# Patient Record
Sex: Female | Born: 1975
Health system: Southern US, Community
[De-identification: ages and names within clinical notes are randomized; demographics above are authoritative.]

## PROBLEM LIST (undated history)

## (undated) DIAGNOSIS — Z862 Personal history of diseases of the blood and blood-forming organs and certain disorders involving the immune mechanism: Secondary | ICD-10-CM

## (undated) DIAGNOSIS — Z973 Presence of spectacles and contact lenses: Secondary | ICD-10-CM

## (undated) DIAGNOSIS — I2699 Other pulmonary embolism without acute cor pulmonale: Secondary | ICD-10-CM

## (undated) DIAGNOSIS — N882 Stricture and stenosis of cervix uteri: Secondary | ICD-10-CM

## (undated) DIAGNOSIS — Z8619 Personal history of other infectious and parasitic diseases: Secondary | ICD-10-CM

## (undated) DIAGNOSIS — R350 Frequency of micturition: Secondary | ICD-10-CM

## (undated) DIAGNOSIS — R112 Nausea with vomiting, unspecified: Secondary | ICD-10-CM

## (undated) DIAGNOSIS — Z85828 Personal history of other malignant neoplasm of skin: Secondary | ICD-10-CM

## (undated) DIAGNOSIS — D66 Hereditary factor VIII deficiency: Secondary | ICD-10-CM

## (undated) DIAGNOSIS — R9389 Abnormal findings on diagnostic imaging of other specified body structures: Secondary | ICD-10-CM

## (undated) DIAGNOSIS — M1990 Unspecified osteoarthritis, unspecified site: Secondary | ICD-10-CM

## (undated) DIAGNOSIS — N939 Abnormal uterine and vaginal bleeding, unspecified: Secondary | ICD-10-CM

## (undated) HISTORY — PX: TONSILLECTOMY: SUR1361

## (undated) HISTORY — PX: THYROGLOSSAL DUCT CYST: SHX297

## (undated) HISTORY — PX: INGUINAL HERNIA REPAIR: SUR1180

---

## 1999-07-25 ENCOUNTER — Encounter (INDEPENDENT_AMBULATORY_CARE_PROVIDER_SITE_OTHER): Payer: Self-pay | Admitting: Specialist

## 1999-07-25 ENCOUNTER — Other Ambulatory Visit: Admission: RE | Admit: 1999-07-25 | Discharge: 1999-07-25 | Payer: Self-pay | Admitting: *Deleted

## 1999-12-28 ENCOUNTER — Other Ambulatory Visit: Admission: RE | Admit: 1999-12-28 | Discharge: 1999-12-28 | Payer: Self-pay | Admitting: *Deleted

## 2000-03-19 ENCOUNTER — Other Ambulatory Visit: Admission: RE | Admit: 2000-03-19 | Discharge: 2000-03-19 | Payer: Self-pay | Admitting: *Deleted

## 2000-11-19 ENCOUNTER — Other Ambulatory Visit: Admission: RE | Admit: 2000-11-19 | Discharge: 2000-11-19 | Payer: Self-pay | Admitting: *Deleted

## 2001-05-03 ENCOUNTER — Encounter: Admission: RE | Admit: 2001-05-03 | Discharge: 2001-05-03 | Payer: Self-pay | Admitting: Obstetrics and Gynecology

## 2001-11-05 ENCOUNTER — Other Ambulatory Visit: Admission: RE | Admit: 2001-11-05 | Discharge: 2001-11-05 | Payer: Self-pay | Admitting: Obstetrics and Gynecology

## 2002-10-31 ENCOUNTER — Other Ambulatory Visit: Admission: RE | Admit: 2002-10-31 | Discharge: 2002-10-31 | Payer: Self-pay | Admitting: Obstetrics and Gynecology

## 2003-11-27 ENCOUNTER — Other Ambulatory Visit: Admission: RE | Admit: 2003-11-27 | Discharge: 2003-11-27 | Payer: Self-pay | Admitting: Obstetrics and Gynecology

## 2006-10-20 ENCOUNTER — Inpatient Hospital Stay (HOSPITAL_COMMUNITY): Admission: EM | Admit: 2006-10-20 | Discharge: 2006-10-27 | Payer: Self-pay | Admitting: Emergency Medicine

## 2006-10-20 DIAGNOSIS — Z86711 Personal history of pulmonary embolism: Secondary | ICD-10-CM

## 2006-10-20 HISTORY — DX: Personal history of pulmonary embolism: Z86.711

## 2006-11-29 ENCOUNTER — Ambulatory Visit: Payer: Self-pay | Admitting: Hematology & Oncology

## 2006-12-05 ENCOUNTER — Ambulatory Visit (HOSPITAL_COMMUNITY): Admission: RE | Admit: 2006-12-05 | Discharge: 2006-12-05 | Payer: Self-pay | Admitting: Family Medicine

## 2006-12-08 ENCOUNTER — Emergency Department (HOSPITAL_COMMUNITY): Admission: EM | Admit: 2006-12-08 | Discharge: 2006-12-08 | Payer: Self-pay | Admitting: Emergency Medicine

## 2007-04-16 DIAGNOSIS — Z87410 Personal history of cervical dysplasia: Secondary | ICD-10-CM

## 2007-04-16 HISTORY — DX: Personal history of cervical dysplasia: Z87.410

## 2008-03-30 ENCOUNTER — Ambulatory Visit: Payer: Self-pay | Admitting: Hematology & Oncology

## 2008-12-06 IMAGING — CR DG CHEST 2V
2 series · 2 of 2 positions shown · non-contrast
Comparison: None.

CLINICAL DATA: Cough and chest pain.
 CHEST - 2 VIEW:

[w chest pa]
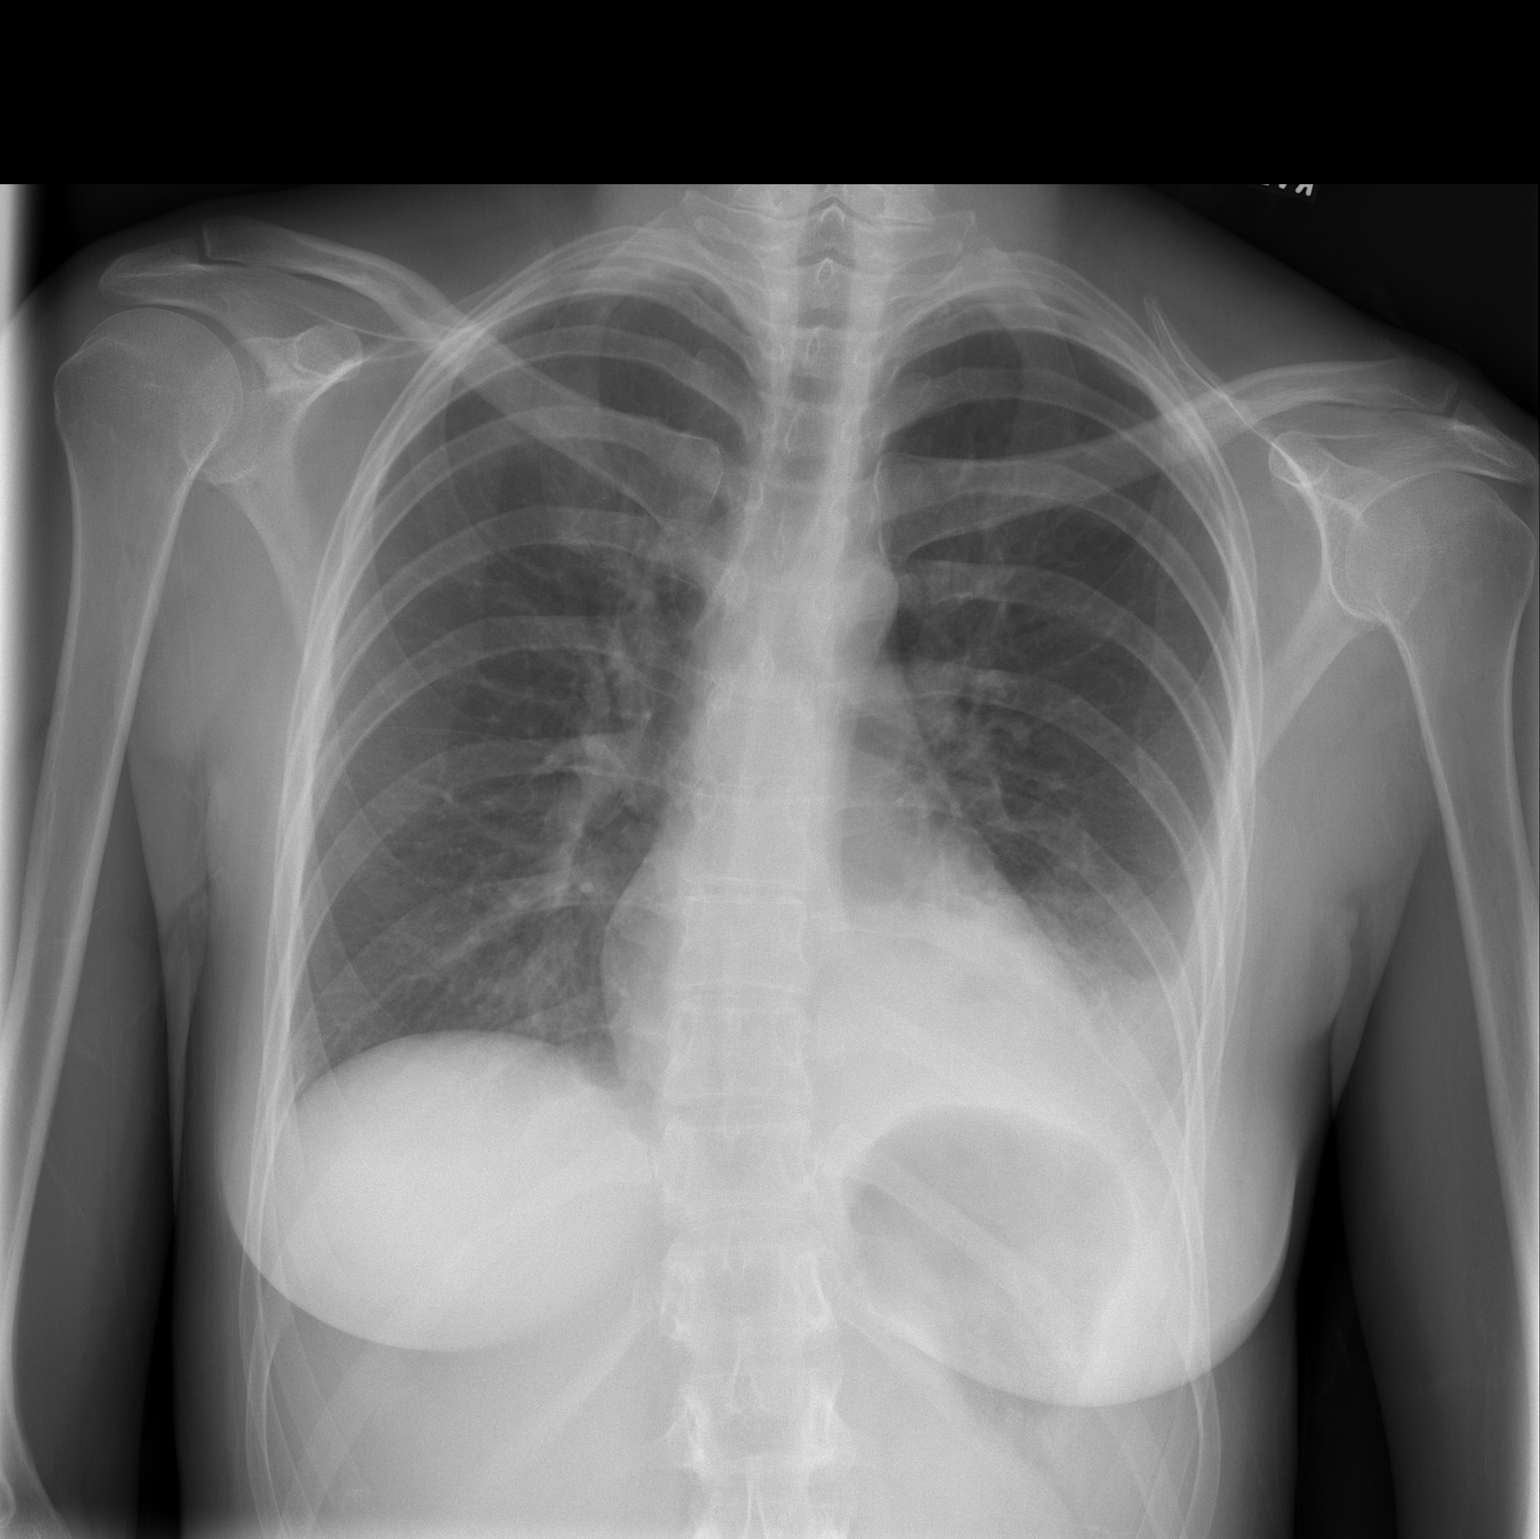

[w chest lat]
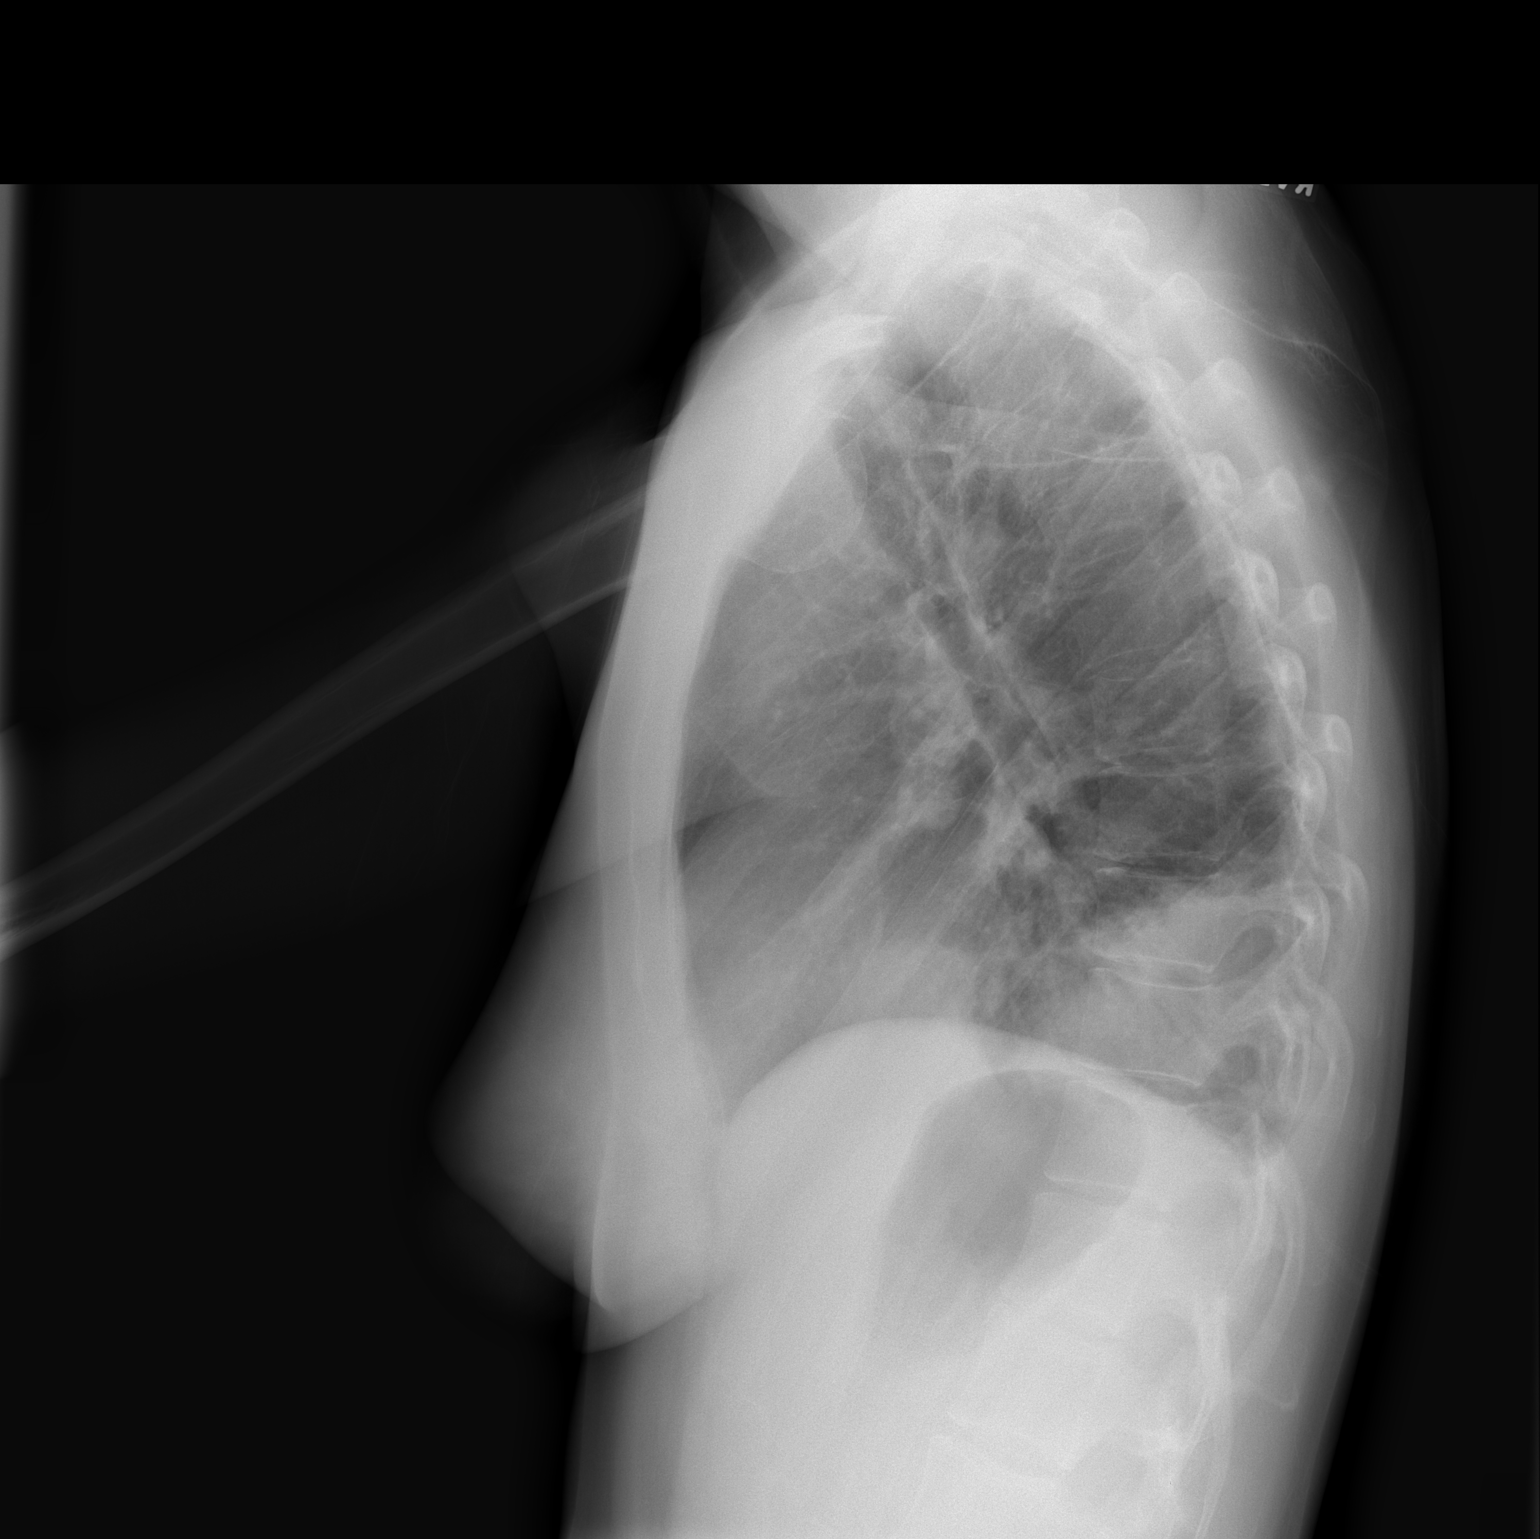

[2 of 2 positions shown; findings below may reference images not displayed]

FINDINGS: There is an infiltrate in the left lower lobe with the appearance of pneumonia.  There may be a small left effusion.  The right lung is clear.
IMPRESSION: Left lower lobe pneumonia.

## 2009-02-09 ENCOUNTER — Emergency Department (HOSPITAL_COMMUNITY): Admission: EM | Admit: 2009-02-09 | Discharge: 2009-02-09 | Payer: Self-pay | Admitting: Emergency Medicine

## 2011-01-25 LAB — DIFFERENTIAL
Basophils Absolute: 0 10*3/uL (ref 0.0–0.1)
Basophils Relative: 0 % (ref 0–1)
Eosinophils Absolute: 0 10*3/uL (ref 0.0–0.7)
Eosinophils Relative: 0 % (ref 0–5)
Lymphocytes Relative: 31 % (ref 12–46)
Lymphs Abs: 1.8 10*3/uL (ref 0.7–4.0)
Monocytes Absolute: 0.3 10*3/uL (ref 0.1–1.0)
Monocytes Relative: 6 % (ref 3–12)
Neutro Abs: 3.6 10*3/uL (ref 1.7–7.7)
Neutrophils Relative %: 62 % (ref 43–77)

## 2011-01-25 LAB — URINALYSIS, ROUTINE W REFLEX MICROSCOPIC
Bilirubin Urine: NEGATIVE
Ketones, ur: NEGATIVE mg/dL
Nitrite: NEGATIVE
Specific Gravity, Urine: 1 — ABNORMAL LOW (ref 1.005–1.030)
pH: 7 (ref 5.0–8.0)

## 2011-01-25 LAB — CBC: HCT: 44 % (ref 36.0–46.0)

## 2011-01-25 LAB — PREGNANCY, URINE: Preg Test, Ur: NEGATIVE

## 2011-01-25 LAB — POCT I-STAT, CHEM 8
BUN: 8 mg/dL (ref 6–23)
Calcium, Ion: 1.19 mmol/L (ref 1.12–1.32)
Chloride: 107 mEq/L (ref 96–112)
Creatinine, Ser: 0.8 mg/dL (ref 0.4–1.2)
Glucose, Bld: 92 mg/dL (ref 70–99)
HCT: 46 % (ref 36.0–46.0)
Hemoglobin: 15.6 g/dL — ABNORMAL HIGH (ref 12.0–15.0)
Potassium: 4.1 mEq/L (ref 3.5–5.1)
Sodium: 141 mEq/L (ref 135–145)
TCO2: 25 mmol/L (ref 0–100)

## 2011-03-03 NOTE — H&P (Signed)
NAME:  Deanna Herman, Deanna Herman            ACCOUNT NO.:  [id number]   MEDICAL RECORD NO.:  [id number]          PATIENT TYPE:  INP   LOCATION:  0101                         FACILITY:  [facility]   PHYSICIAN:  Kela Millin, M.D.DATE OF BIRTH:  13-Sep-1976   DATE OF ADMISSION:  10/20/2006  DATE OF DISCHARGE:                              HISTORY & PHYSICAL   CHIEF COMPLAINT:  Left chest pain.   HPI:  A 35 year old female with a history of tobacco use and OCP use in  the form of Yasmin 28.  She recently traveled by car from [place] to  [place] and back during the Christmas holidays and also since then  has taken two plane flights, one approximately 2 hours and one  approximately 45 minutes.  She went to an urgent care facility in [place]  two days ago and apparently had a left lower lobe infiltrate and chest  pain and was given Levaquin and Vicodin.  The chest pain continued, she  developed hemoptysis, she came to the [facility] Emergency Room today  and had a CT scan of the chest which revealed a large left lower lobe  pulmonary embolus.  She does have a slightly high PTT.  She is admitted  now for Lovenox and Coumadin pain control and monitoring.   MEDICATIONS:  1. Yasmin 28 daily.  2. Acyclovir 200 mg daily.  3. Levaquin and Vicodin for 2 days.   PAST MEDICAL HISTORY:  1. Recurrent genital herpes, on prophylaxis therapy.  2. Tobacco use - approximately 6 cigarettes a day.   ALLERGIES:  LATEX.   PAST SURGICAL HISTORY:  1. Thyroglossal cyst surgery in the distant past.  2. Bilateral inguinal hernia repair approximately at age 46.  3. Tonsillectomy.   FAMILY HISTORY:  Significant for:  1. Hypertension.  2. Obesity.  3. Type 2 diabetes mellitus but no clotting disorders.   REVIEW OF SYSTEMS:  As per HPI.  Denies fevers.   PHYSICAL EXAM:  An alert female, oriented, complaining of left chest  pain.  Temperature 98.6, blood pressure 129/74, pulse 75 and regular,  respiratory rate  is 20 and nonlabored.  The O2 saturation on room air is  97%.  HEENT:  Reveals her to be atraumatic, normocephalic.  Oropharynx is  clear.  NECK:  Supple without JVD.  She does have a scar over the left anterior  neck well healed.  CHEST:  With decreased breath sounds left lateral chest, otherwise  clear.  Increased pain with inspiration in the left lateral chest.  CARDIAC:  A regular rhythm.  No murmurs or gallops.  ABDOMEN:  Soft, nontender, bowel sounds are normal.  EXTREMITIES:  Without cyanosis, clubbing or edema.  No Homan's sign or  painful cords in the thighs or calves.  No pain the lower extremities to  palpation.  NEUROLOGICAL:  Oriented x3, nonfocal.  SKIN:  Without rashes.   Chest x-ray reveals a left lower lobe infiltrate.   A CT angio of the chest reveals a large left pulmonary embolus in the  lung base.   EKG reveals sinus rhythm - normal EKG.   White count  9500, hemoglobin 12.4, platelet count 337,000.  Sodium 134,  potassium 3.5, chloride 101, bicarb 25, BUN 7, creatinine 0.67, glucose  102.  LFTs are normal.  Calcium 9.1, PT is 14.6 seconds, PTT is 41  seconds and INR is 1.1.   ASSESSMENT:  A 35 year old female with pulmonary embolus with multiple  risk factors, the travel, tobacco use and oral contraceptive pill use.  She does have a mild increase in her PTT and will check for lupus  anticoagulant and anticardiolipin antibody and also check Factor 5  Leiden Assay.  Lovenox started tonight and is able to start Coumadin.   PLAN:  1. PE - Lovenox and Coumadin and Pharmacy to monitor.  2. Pain - Treat with IV Toradol for 48 hours and also Percocet.  3. Tobacco use - Consider Chantix on discharge from the hospital -      Patient perhaps would like to try it at that time but not now.  4. History of genital herpes - Continue acyclovir prophylaxis.  5. Increased PTT - As above.  I wonder if this may be slightly      elevated secondary to heavy clot burden.  Again  will check lupus      anticoagulant and anticardiolipin antibody.  6. Change birth control methods to not incorporate progestins and      estrogens.      Candyce Churn, M.D.      Kela Millin, M.D.  Electronically Signed    RNG/MEDQ  D:  10/21/2006  T:  10/21/2006  Job:  [id number]   cc:   Sigmund Hazel, M.D.  Fax: [phone]   Kela Millin, M.D.

## 2011-03-03 NOTE — Discharge Summary (Signed)
NAME:  Deanna Herman, Deanna Herman            ACCOUNT NO.:  [id number]   MEDICAL RECORD NO.:  [id number]          PATIENT TYPE:  INP   LOCATION:  1444                         FACILITY:  [facility]   PHYSICIAN:  Michelene Gardener, MD    DATE OF BIRTH:  08-May-1976   DATE OF ADMISSION:  10/20/2006  DATE OF DISCHARGE:  08/28/2007                               DISCHARGE SUMMARY   DISCHARGE DIAGNOSES:  1. Pulmonary embolus.  2. Chest pain.  3. Tobacco abuse.  4. History of genital herpes.  5. Iron deficiency anemia.  6. Upper respiratory tract infection.   DISCHARGE MEDICATIONS:  1. Coumadin 12.5 mg p.o. once daily.  2. Ferrous sulfate 325 mg  p.o. twice daily.  3. Acyclovir 200 mg p.o. daily.  4. Yasmin 28 daily.   PROCEDURES:  1. Chest x-ray on January 5, showed left lower lobe pneumonia.  2. Chest angiogram showed pulmonary embolism in the process of      pulmonary artery to the left lower lobe which is partially      occluded.   FOLLOW UP:  Followup appointment on Monday, October 29, 2006.   CONDITION ON DISCHARGE:  Stable.   REASON FOR ADMISSION:  This is a 35 year old female with history of  tobacco abuse on oral contraceptive pills [place] during the [place] holiday  presenting with increasing left-sided chest pain.   HOSPITAL COURSE:  Problem 1.  PULMONARY EMBOLISM:  This patient had CT  scan of the chest and the results were mentioned above.  The patient was  admitted to the hospital and started initially on oxygen that was  discontinued after her saturation was improved.  She was also started on  pain medicine to control her left-sided chest pain.  This patient was  put on Lovenox and Coumadin.  Her INR was slow to rise.  Coumadin has  been adjusted by the pharmacy.  In the last 2 days, her INR was 1.7.  Coumadin was increased to 12.5 in the last 2 days of her  hospitalization.  She was given Coumadin prescription to take  on  Saturday, Sunday and then to follow with her primary physician on Monday  to get her INR checked and adjust Coumadin as needed.   Problem 2.  LEFT-SIDED PAIN SECONDARY TO HER PULMONARY EMBOLISM:  This  was controlled with pain medicine.  At the time of discharge, her pain  resolved.   Problem 3.  PNEUMONIA:  The patient has left-sided possible infiltrate.  She was given antibiotics during her hospitalization.  She already  completed 6 days of antibiotics and there is no need for more  antibiotics.   Problem 4.  ANEMIA:  The patient was started on iron and ferrous  sulfate.  This can be checked as an outpatient by her primary physician  to determine if she needs to continue the iron sulfate or not.   Problem 5.  GENITALIA HERPES:  The patient is getting acyclovir  prophylaxis.  She is recommended to continue the same.   Total assessment time 40 minutes.  Michelene Gardener, MD  Electronically Signed     NAE/MEDQ  D:  10/27/2006  T:  10/27/2006  Job:  [id number]   cc:   Sigmund Hazel, M.D.  Fax: [phone]

## 2013-05-14 ENCOUNTER — Other Ambulatory Visit: Payer: Self-pay | Admitting: Family Medicine

## 2013-05-14 ENCOUNTER — Ambulatory Visit
Admission: RE | Admit: 2013-05-14 | Discharge: 2013-05-14 | Disposition: A | Discharge status: Home / Self Care | Payer: 59 | Source: Ambulatory Visit | Attending: Family Medicine | Admitting: Family Medicine

## 2013-05-14 DIAGNOSIS — R51 Headache: Secondary | ICD-10-CM

## 2015-05-18 ENCOUNTER — Other Ambulatory Visit: Payer: Self-pay | Admitting: Family Medicine

## 2015-05-18 DIAGNOSIS — R221 Localized swelling, mass and lump, neck: Secondary | ICD-10-CM

## 2015-05-21 ENCOUNTER — Ambulatory Visit
Admission: RE | Admit: 2015-05-21 | Discharge: 2015-05-21 | Disposition: A | Discharge status: Home / Self Care | Payer: 59 | Source: Ambulatory Visit | Attending: Family Medicine | Admitting: Family Medicine

## 2015-05-21 DIAGNOSIS — R221 Localized swelling, mass and lump, neck: Secondary | ICD-10-CM

## 2015-05-21 MED ORDER — IOPAMIDOL (ISOVUE-300) INJECTION 61%
75.0000 mL | Freq: Once | INTRAVENOUS | Status: AC | PRN
  Administered 2015-05-21: 75 mL via INTRAVENOUS

## 2016-10-19 DIAGNOSIS — H905 Unspecified sensorineural hearing loss: Secondary | ICD-10-CM | POA: Diagnosis not present

## 2016-10-19 DIAGNOSIS — Z23 Encounter for immunization: Secondary | ICD-10-CM | POA: Diagnosis not present

## 2016-10-19 DIAGNOSIS — Z0000 Encounter for general adult medical examination without abnormal findings: Secondary | ICD-10-CM | POA: Diagnosis not present

## 2016-10-19 DIAGNOSIS — M545 Low back pain: Secondary | ICD-10-CM | POA: Diagnosis not present

## 2016-12-26 DIAGNOSIS — D225 Melanocytic nevi of trunk: Secondary | ICD-10-CM | POA: Diagnosis not present

## 2016-12-26 DIAGNOSIS — L700 Acne vulgaris: Secondary | ICD-10-CM | POA: Diagnosis not present

## 2016-12-26 DIAGNOSIS — D2262 Melanocytic nevi of left upper limb, including shoulder: Secondary | ICD-10-CM | POA: Diagnosis not present

## 2017-04-24 DIAGNOSIS — Z01419 Encounter for gynecological examination (general) (routine) without abnormal findings: Secondary | ICD-10-CM | POA: Diagnosis not present

## 2017-04-24 DIAGNOSIS — R6889 Other general symptoms and signs: Secondary | ICD-10-CM | POA: Diagnosis not present

## 2017-07-07 IMAGING — CT CT NECK W/ CM
4 of 6 series · 14 of 33 positions shown, 16 images · IV contrast (75CC ISOVUE 300)
Comparison: None.

CLINICAL DATA: Thyroglossal duct cyst with surgery 3333 and 7650.
Discomfort when swallowing. Hoarseness.

EXAM:
CT NECK WITH CONTRAST
TECHNIQUE: Multidetector CT imaging of the neck was performed using the
standard protocol following the bolus administration of intravenous
contrast.
CONTRAST:  75mL J4CVNL-VGG IOPAMIDOL (J4CVNL-VGG) INJECTION 61%

[Series 3: axial neck · axial · 0.39mm/px · z∈[+140,+218]mm · 2 of 95 slices shown]
[im 32/95  bone]
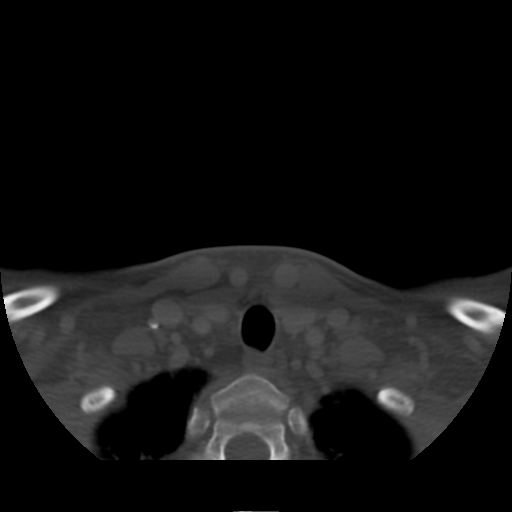
[im 63/95  bone]
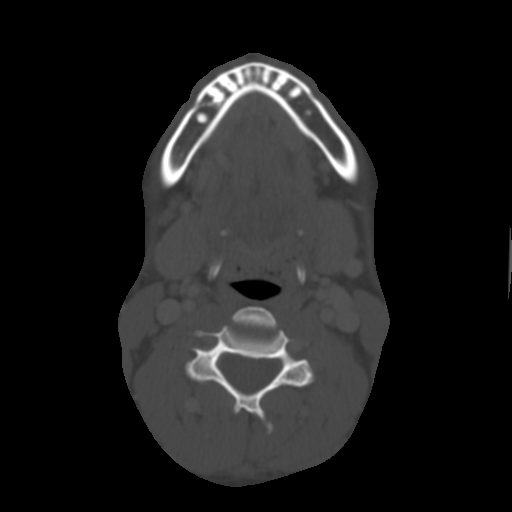

[Series 200: coronal · coronal · 0.47mm/px · 3 of 98 slices shown]
[im 24/98  bone]
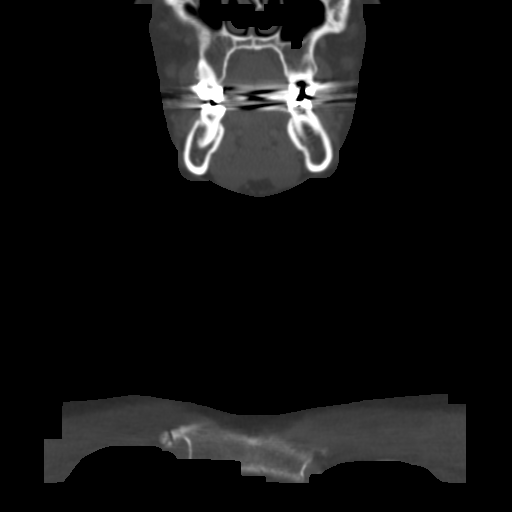
[im 41/98  bone]
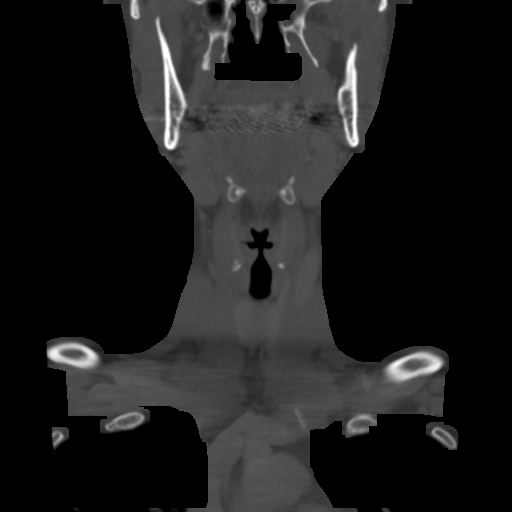
[im 57/98  bone]
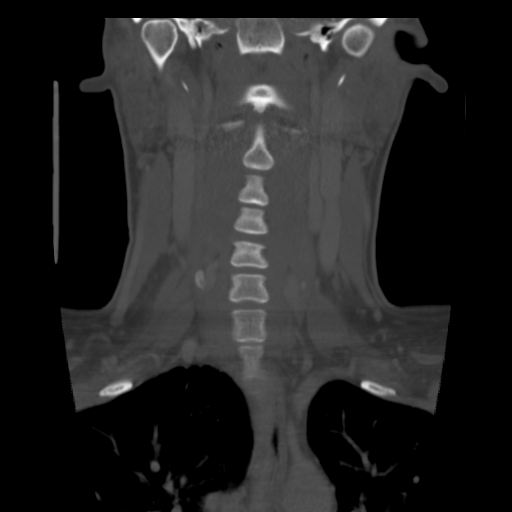

[Series 201: sagittal · sagittal · 0.47mm/px · 5 of 102 slices shown, 6 images]
[im 34/102  bone]
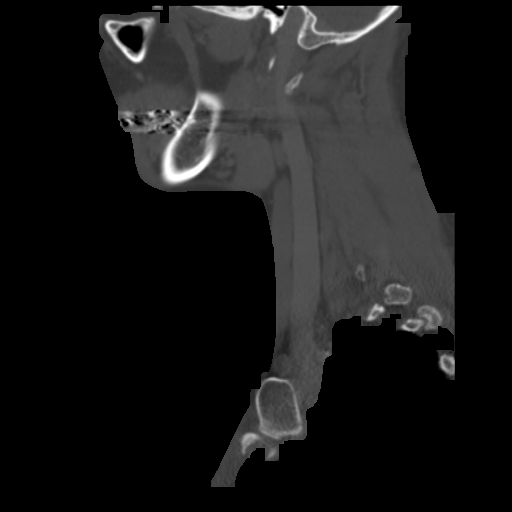
[im 43/102  bone]
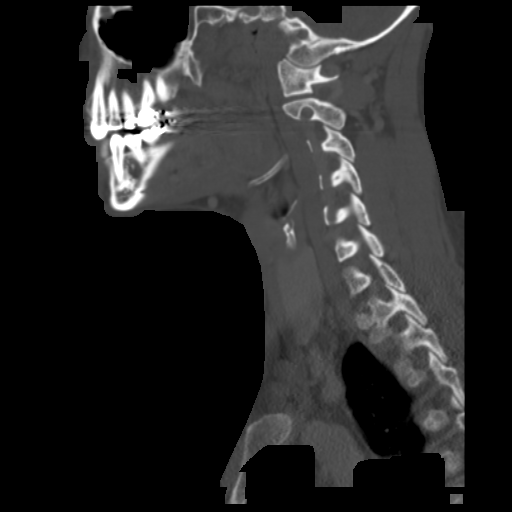
[im 51/102  soft-tissue]
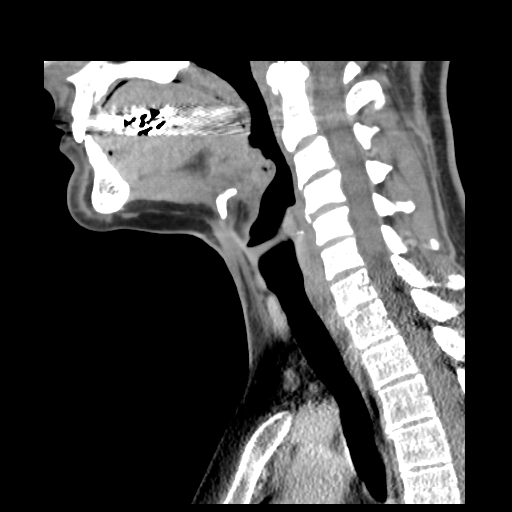
[im 51/102  bone]
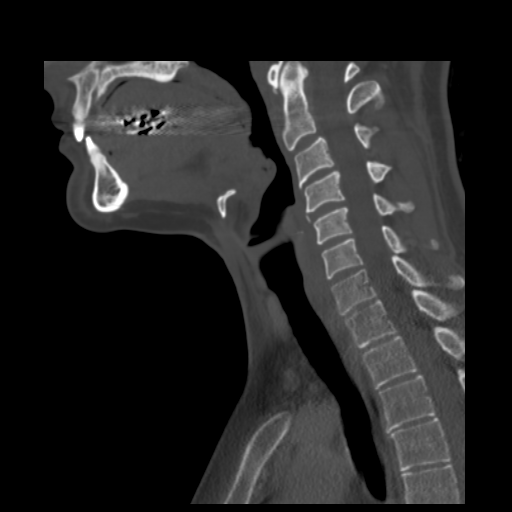
[im 59/102  bone]
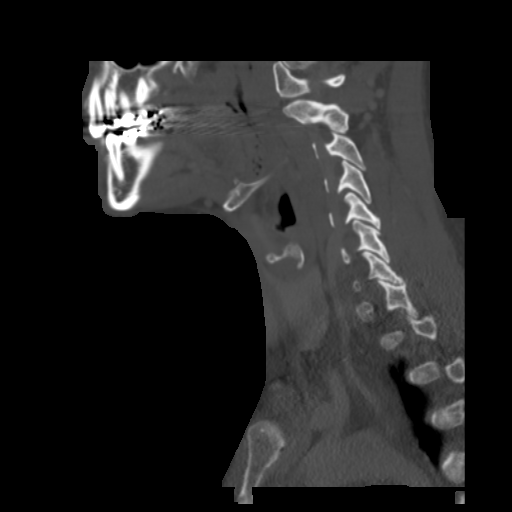
[im 68/102  bone]
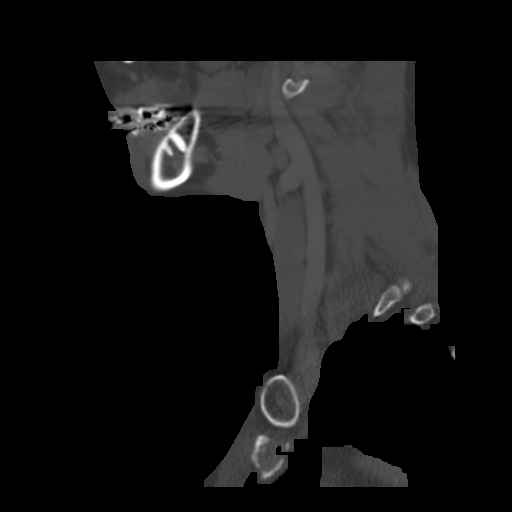

[Series 202: angle for hyoid · axial · 0.47mm/px · z∈[+77,+230]mm · 4 of 130 slices shown, 5 images]
[im 26/130  soft-tissue]
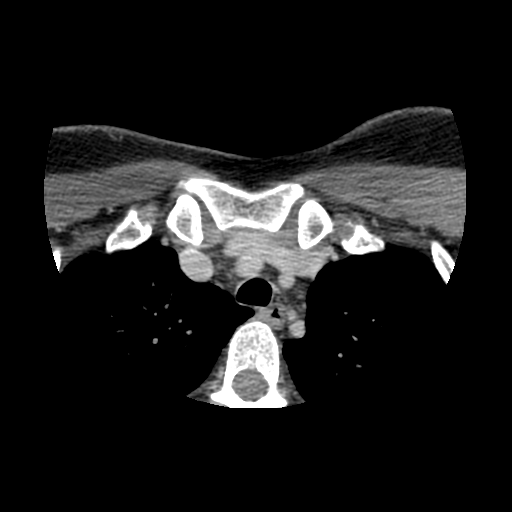
[im 26/130  bone]
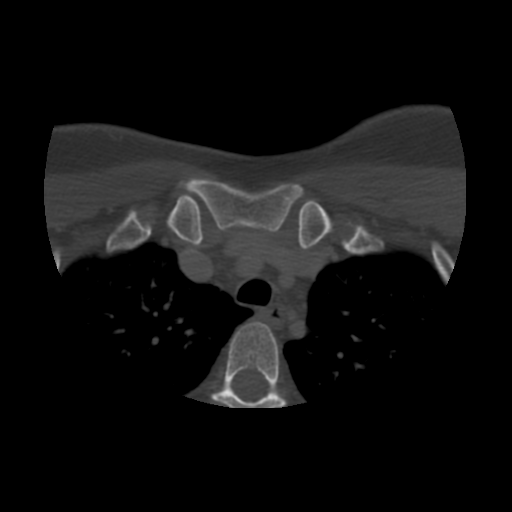
[im 52/130  bone]
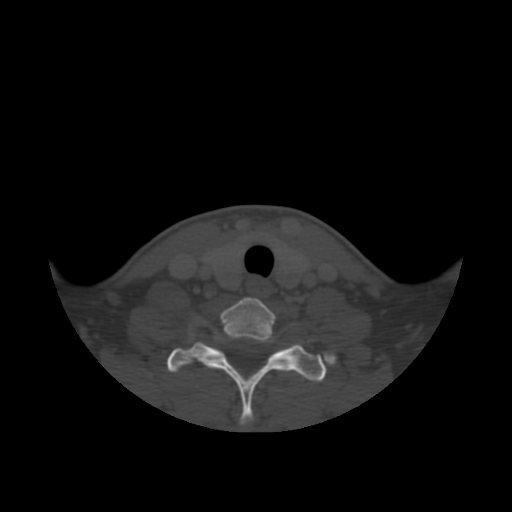
[im 78/130  bone]
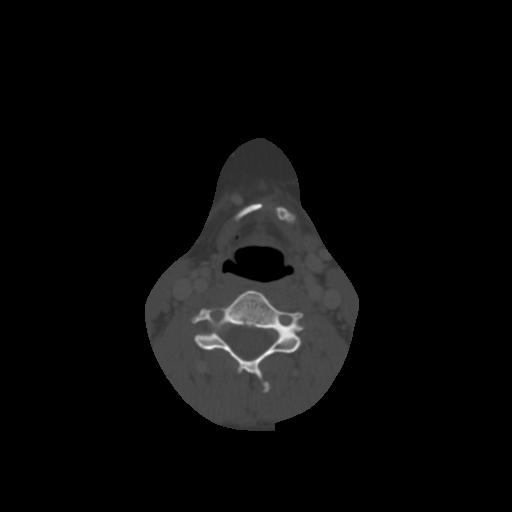
[im 104/130  bone]
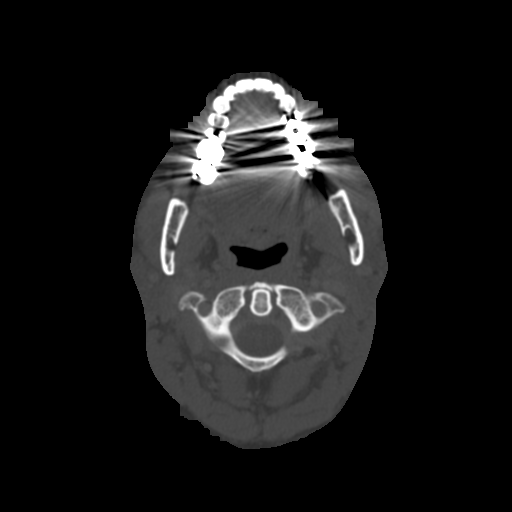

[14 of 33 positions shown; findings below may reference images not displayed]

FINDINGS: Soft tissue thickening in the anterior neck just below the hyoid
bone. There is mild enhancement of the anterior strap muscles with
an ill-defined 6 mm low-density area which appears to be a collapsed
cyst with irregular enhancement of the wall. There is also some skin
thickening in the area. Findings appear to represent inflammation or
infection.

Pharynx and larynx: Negative

Salivary glands: Negative

Thyroid: Normal thyroid.

Lymph nodes: Negative for adenopathy. 6.7 mm left level 2 lymph node

Vascular: Carotid artery normal.  Jugular vein patent.

Limited intracranial: Negative

Visualized orbits: Not imaged

Mastoids and visualized paranasal sinuses: Negative

Skeleton: Mild disc degeneration C3-4 and C4-5. No acute bony
abnormality

Upper chest: Lung apices clear.
IMPRESSION: Soft tissue thickening in the anterior neck does below the hyoid
bone in the strap muscles. There is a ill-defined 6 mm collapsed
cyst with enhancement of the wall and thickening of the overlying
skin. Findings are suggestive of inflammation of a residual
thyroglossal duct cyst. Otherwise negative

## 2017-08-01 DIAGNOSIS — Z808 Family history of malignant neoplasm of other organs or systems: Secondary | ICD-10-CM | POA: Diagnosis not present

## 2017-08-01 DIAGNOSIS — D225 Melanocytic nevi of trunk: Secondary | ICD-10-CM | POA: Diagnosis not present

## 2017-08-01 DIAGNOSIS — Z86018 Personal history of other benign neoplasm: Secondary | ICD-10-CM | POA: Diagnosis not present

## 2017-09-26 DIAGNOSIS — R1032 Left lower quadrant pain: Secondary | ICD-10-CM | POA: Diagnosis not present

## 2017-10-23 DIAGNOSIS — L237 Allergic contact dermatitis due to plants, except food: Secondary | ICD-10-CM | POA: Diagnosis not present

## 2017-11-15 DIAGNOSIS — R102 Pelvic and perineal pain: Secondary | ICD-10-CM | POA: Diagnosis not present

## 2018-04-09 DIAGNOSIS — S0501XA Injury of conjunctiva and corneal abrasion without foreign body, right eye, initial encounter: Secondary | ICD-10-CM | POA: Diagnosis not present

## 2018-04-24 ENCOUNTER — Encounter (HOSPITAL_COMMUNITY): Payer: Self-pay | Admitting: Emergency Medicine

## 2018-04-24 ENCOUNTER — Ambulatory Visit (HOSPITAL_COMMUNITY)
Admission: EM | Admit: 2018-04-24 | Discharge: 2018-04-24 | Disposition: A | Discharge status: Home / Self Care | Payer: 59 | Attending: Family Medicine | Admitting: Family Medicine

## 2018-04-24 DIAGNOSIS — S61217A Laceration without foreign body of left little finger without damage to nail, initial encounter: Secondary | ICD-10-CM

## 2018-04-24 DIAGNOSIS — W260XXA Contact with knife, initial encounter: Secondary | ICD-10-CM | POA: Diagnosis not present

## 2018-04-24 HISTORY — DX: Other pulmonary embolism without acute cor pulmonale: I26.99

## 2018-04-24 NOTE — ED Triage Notes (Signed)
Pt sts left 5th finger laceration; bleeding controlled

## 2018-04-24 NOTE — Discharge Instructions (Signed)
Steristrips applied. This will fall off on its own when the wound is healed, usually 7-10 days. Try to keep current dressing with finger splint on so it doesn't pop open. Monitor for spreading redness, increased warmth, fever, follow up for reevaluation.

## 2018-04-24 NOTE — ED Provider Notes (Signed)
[place]    CSN: [id number] Arrival date & time: 04/24/18  1611     History   Chief Complaint Chief Complaint  Patient presents with  . Laceration    HPI Deanna Herman is a 42 y.o. female.   42 year old female comes in for laceration to the left fifth finger from a box cutter.  States was trying to open a box when it occurred.  Cleaned wound with water, applied antibiotic ointment, and came in for evaluation.  Denies numbness, tingling to the fingers.  Denies trouble moving fingers.  Bleeding controlled with pressure. Not on any blood thinners.  Up-to-date on tetanus.     Past Medical History:  Diagnosis Date  . Pulmonary emboli (HCC)     There are no active problems to display for this patient.   History reviewed. No pertinent surgical history.  OB History   None      Home Medications    Prior to Admission medications   Not on File    Family History History reviewed. No pertinent family history.  Social History Social History   Tobacco Use  . Smoking status: Never Smoker  . Smokeless tobacco: Never Used  Substance Use Topics  . Alcohol use: Yes  . Drug use: Never     Allergies   Latex and Tamiflu [oseltamivir phosphate]   Review of Systems Review of Systems  Reason unable to perform ROS: See HPI as above.     Physical Exam Triage Vital Signs ED Triage Vitals [04/24/18 1644]  Enc Vitals Group     BP 133/72     Pulse Rate 63     Resp 18     Temp 98.3 F (36.8 C)     Temp Source Oral     SpO2 100 %     Weight      Height      Head Circumference      Peak Flow      Pain Score      Pain Loc      Pain Edu?      Excl. in [place]?    No data found.  Updated Vital Signs BP 133/72 (BP Location: Left Arm)   Pulse 63   Temp 98.3 F (36.8 C) (Oral)   Resp 18   SpO2 100%   Physical Exam  Constitutional: She is oriented to person, place, and time. She appears well-developed and well-nourished. No distress.  HENT:    Head: Normocephalic and atraumatic.  Eyes: Pupils are equal, round, and reactive to light. Conjunctivae are normal.  Musculoskeletal:  Left fifth finger oblique laceration on the PIP. Bleeding controlled. Full ROM. Sensation intact, cap refill <2s  Neurological: She is alert and oriented to person, place, and time.  Skin: She is not diaphoretic.     UC Treatments / Results  Labs (all labs ordered are listed, but only abnormal results are displayed) Labs Reviewed - No data to display  EKG None  Radiology No results found.  Procedures Laceration Repair Date/Time: 04/24/2018 9:00 PM Performed by: Ok Edwards, PA-C Authorized by: Vanessa Kick, MD   Consent:    Consent obtained:  Verbal   Consent given by:  Patient   Risks discussed:  Infection, pain, poor cosmetic result and poor wound healing   Alternatives discussed:  No treatment Anesthesia (see MAR for exact dosages):    Anesthesia method:  None Laceration details:    Location:  Finger   Finger location:  L  small finger   Wound length (cm): cresent shaped, 1cm.   Depth (mm):  1 Repair type:    Repair type:  Simple Pre-procedure details:    Preparation:  Patient was prepped and draped in usual sterile fashion Exploration:    Hemostasis achieved with:  Direct pressure   Wound exploration: wound explored through full range of motion and entire depth of wound probed and visualized   Treatment:    Area cleansed with:  Betadine   Amount of cleaning:  Standard   Irrigation solution:  Sterile saline   Irrigation method:  Pressure wash and tap Skin repair:    Repair method:  Steri-Strips   Number of Steri-Strips:  4 Approximation:    Approximation:  Close Post-procedure details:    Dressing:  Open (no dressing)   Patient tolerance of procedure:  Tolerated well, no immediate complications   (including critical care time)  Medications Ordered in UC Medications - No data to display  Initial Impression / Assessment  and Plan / UC Course  I have reviewed the triage vital signs and the nursing notes.  Pertinent labs & imaging results that were available during my care of the patient were reviewed by me and considered in my medical decision making (see chart for details).    Patient tolerated procedure well.  Finger splint applied to prevent bending of the fingers.  Healing process discussed.  Return precautions given.  Patient expresses understanding and agrees to plan.  Final Clinical Impressions(s) / UC Diagnoses   Final diagnoses:  Laceration of left little finger without foreign body without damage to nail, initial encounter    ED Prescriptions    None         Ok Edwards, PA-C 04/24/18 2103

## 2018-05-07 DIAGNOSIS — Z1231 Encounter for screening mammogram for malignant neoplasm of breast: Secondary | ICD-10-CM | POA: Diagnosis not present

## 2018-05-07 DIAGNOSIS — Z01419 Encounter for gynecological examination (general) (routine) without abnormal findings: Secondary | ICD-10-CM | POA: Diagnosis not present

## 2018-08-21 DIAGNOSIS — Z808 Family history of malignant neoplasm of other organs or systems: Secondary | ICD-10-CM | POA: Diagnosis not present

## 2018-08-21 DIAGNOSIS — Z86018 Personal history of other benign neoplasm: Secondary | ICD-10-CM | POA: Diagnosis not present

## 2018-08-21 DIAGNOSIS — D225 Melanocytic nevi of trunk: Secondary | ICD-10-CM | POA: Diagnosis not present

## 2018-08-21 DIAGNOSIS — D2261 Melanocytic nevi of right upper limb, including shoulder: Secondary | ICD-10-CM | POA: Diagnosis not present

## 2018-08-21 DIAGNOSIS — L739 Follicular disorder, unspecified: Secondary | ICD-10-CM | POA: Diagnosis not present

## 2018-08-21 DIAGNOSIS — D485 Neoplasm of uncertain behavior of skin: Secondary | ICD-10-CM | POA: Diagnosis not present

## 2018-09-19 DIAGNOSIS — R197 Diarrhea, unspecified: Secondary | ICD-10-CM | POA: Diagnosis not present

## 2018-10-24 DIAGNOSIS — W11XXXA Fall on and from ladder, initial encounter: Secondary | ICD-10-CM | POA: Diagnosis not present

## 2018-10-24 DIAGNOSIS — M25561 Pain in right knee: Secondary | ICD-10-CM | POA: Diagnosis not present

## 2018-10-25 DIAGNOSIS — S82001A Unspecified fracture of right patella, initial encounter for closed fracture: Secondary | ICD-10-CM | POA: Diagnosis not present

## 2018-11-01 DIAGNOSIS — S82001D Unspecified fracture of right patella, subsequent encounter for closed fracture with routine healing: Secondary | ICD-10-CM | POA: Diagnosis not present

## 2018-11-01 DIAGNOSIS — S82001A Unspecified fracture of right patella, initial encounter for closed fracture: Secondary | ICD-10-CM | POA: Diagnosis not present

## 2018-11-11 DIAGNOSIS — S82001A Unspecified fracture of right patella, initial encounter for closed fracture: Secondary | ICD-10-CM | POA: Diagnosis not present

## 2018-11-11 DIAGNOSIS — M25561 Pain in right knee: Secondary | ICD-10-CM | POA: Diagnosis not present

## 2018-11-11 DIAGNOSIS — S82001D Unspecified fracture of right patella, subsequent encounter for closed fracture with routine healing: Secondary | ICD-10-CM | POA: Diagnosis not present

## 2018-11-25 DIAGNOSIS — S82001D Unspecified fracture of right patella, subsequent encounter for closed fracture with routine healing: Secondary | ICD-10-CM | POA: Diagnosis not present

## 2018-11-25 DIAGNOSIS — S82001A Unspecified fracture of right patella, initial encounter for closed fracture: Secondary | ICD-10-CM | POA: Diagnosis not present

## 2018-12-16 DIAGNOSIS — S82001A Unspecified fracture of right patella, initial encounter for closed fracture: Secondary | ICD-10-CM | POA: Diagnosis not present

## 2018-12-16 DIAGNOSIS — S82001D Unspecified fracture of right patella, subsequent encounter for closed fracture with routine healing: Secondary | ICD-10-CM | POA: Diagnosis not present

## 2019-06-27 DIAGNOSIS — D1809 Hemangioma of other sites: Secondary | ICD-10-CM | POA: Diagnosis not present

## 2019-06-27 DIAGNOSIS — Z01419 Encounter for gynecological examination (general) (routine) without abnormal findings: Secondary | ICD-10-CM | POA: Diagnosis not present

## 2019-06-27 DIAGNOSIS — Z1231 Encounter for screening mammogram for malignant neoplasm of breast: Secondary | ICD-10-CM | POA: Diagnosis not present

## 2019-06-27 DIAGNOSIS — N9089 Other specified noninflammatory disorders of vulva and perineum: Secondary | ICD-10-CM | POA: Diagnosis not present

## 2019-06-27 DIAGNOSIS — Z6830 Body mass index (BMI) 30.0-30.9, adult: Secondary | ICD-10-CM | POA: Diagnosis not present

## 2019-09-17 DIAGNOSIS — Z20828 Contact with and (suspected) exposure to other viral communicable diseases: Secondary | ICD-10-CM | POA: Diagnosis not present

## 2019-09-24 DIAGNOSIS — D225 Melanocytic nevi of trunk: Secondary | ICD-10-CM | POA: Diagnosis not present

## 2019-09-24 DIAGNOSIS — D2272 Melanocytic nevi of left lower limb, including hip: Secondary | ICD-10-CM | POA: Diagnosis not present

## 2019-09-24 DIAGNOSIS — Z808 Family history of malignant neoplasm of other organs or systems: Secondary | ICD-10-CM | POA: Diagnosis not present

## 2019-09-24 DIAGNOSIS — Z86018 Personal history of other benign neoplasm: Secondary | ICD-10-CM | POA: Diagnosis not present

## 2019-09-24 DIAGNOSIS — L570 Actinic keratosis: Secondary | ICD-10-CM | POA: Diagnosis not present

## 2019-09-24 DIAGNOSIS — D485 Neoplasm of uncertain behavior of skin: Secondary | ICD-10-CM | POA: Diagnosis not present

## 2019-10-06 DIAGNOSIS — Z20828 Contact with and (suspected) exposure to other viral communicable diseases: Secondary | ICD-10-CM | POA: Diagnosis not present

## 2020-01-01 ENCOUNTER — Ambulatory Visit: Payer: Self-pay | Attending: Internal Medicine

## 2020-01-01 DIAGNOSIS — Z23 Encounter for immunization: Secondary | ICD-10-CM

## 2020-01-01 NOTE — Progress Notes (Signed)
   Covid-19 Vaccination Clinic  Name:  Deanna Herman    MRN: [id number] DOB: Dec 24, 1975  01/01/2020  [name] was observed post Covid-19 immunization for 15 minutes without incident. She was provided with Vaccine Information Sheet and instruction to access the V-Safe system.   [name] was instructed to call 911 with any severe reactions post vaccine: Marland Kitchen Difficulty breathing  . Swelling of face and throat  . A fast heartbeat  . A bad rash all over body  . Dizziness and weakness   Immunizations Administered    Name Date Dose VIS Date Route   Pfizer COVID-19 Vaccine 01/01/2020 10:06 AM 0.3 mL 09/26/2019 Intramuscular   Manufacturer: [place]   Lot: [id number]   [place]: [id number]

## 2020-01-13 DIAGNOSIS — Z872 Personal history of diseases of the skin and subcutaneous tissue: Secondary | ICD-10-CM | POA: Diagnosis not present

## 2020-01-13 DIAGNOSIS — L570 Actinic keratosis: Secondary | ICD-10-CM | POA: Diagnosis not present

## 2020-01-26 ENCOUNTER — Ambulatory Visit: Payer: Self-pay | Attending: Internal Medicine

## 2020-01-26 DIAGNOSIS — Z23 Encounter for immunization: Secondary | ICD-10-CM

## 2020-01-26 NOTE — Progress Notes (Signed)
   Covid-19 Vaccination Clinic  Name:  Deanna Herman    MRN: [id number] DOB: 1976-07-27  01/26/2020  [name] was observed post Covid-19 immunization for 15 minutes without incident. She was provided with Vaccine Information Sheet and instruction to access the V-Safe system.   [name] was instructed to call 911 with any severe reactions post vaccine: Marland Kitchen Difficulty breathing  . Swelling of face and throat  . A fast heartbeat  . A bad rash all over body  . Dizziness and weakness   Immunizations Administered    Name Date Dose VIS Date Route   Pfizer COVID-19 Vaccine 01/26/2020  9:22 AM 0.3 mL 09/26/2019 Intramuscular   Manufacturer: [place]   Lot: [id number]   [place]: [id number]

## 2020-02-24 DIAGNOSIS — R102 Pelvic and perineal pain: Secondary | ICD-10-CM | POA: Diagnosis not present

## 2020-05-05 DIAGNOSIS — B351 Tinea unguium: Secondary | ICD-10-CM | POA: Diagnosis not present

## 2020-05-25 DIAGNOSIS — J029 Acute pharyngitis, unspecified: Secondary | ICD-10-CM | POA: Diagnosis not present

## 2020-05-26 DIAGNOSIS — R05 Cough: Secondary | ICD-10-CM | POA: Diagnosis not present

## 2020-06-29 DIAGNOSIS — Z6831 Body mass index (BMI) 31.0-31.9, adult: Secondary | ICD-10-CM | POA: Diagnosis not present

## 2020-06-29 DIAGNOSIS — Z1231 Encounter for screening mammogram for malignant neoplasm of breast: Secondary | ICD-10-CM | POA: Diagnosis not present

## 2020-06-29 DIAGNOSIS — Z01419 Encounter for gynecological examination (general) (routine) without abnormal findings: Secondary | ICD-10-CM | POA: Diagnosis not present

## 2020-08-20 DIAGNOSIS — Z20822 Contact with and (suspected) exposure to covid-19: Secondary | ICD-10-CM | POA: Diagnosis not present

## 2020-08-27 DIAGNOSIS — Z13220 Encounter for screening for lipoid disorders: Secondary | ICD-10-CM | POA: Diagnosis not present

## 2020-08-27 DIAGNOSIS — Z0000 Encounter for general adult medical examination without abnormal findings: Secondary | ICD-10-CM | POA: Diagnosis not present

## 2020-09-28 DIAGNOSIS — D485 Neoplasm of uncertain behavior of skin: Secondary | ICD-10-CM | POA: Diagnosis not present

## 2020-09-28 DIAGNOSIS — D225 Melanocytic nevi of trunk: Secondary | ICD-10-CM | POA: Diagnosis not present

## 2020-09-28 DIAGNOSIS — L814 Other melanin hyperpigmentation: Secondary | ICD-10-CM | POA: Diagnosis not present

## 2020-09-28 DIAGNOSIS — Z86018 Personal history of other benign neoplasm: Secondary | ICD-10-CM | POA: Diagnosis not present

## 2020-09-28 DIAGNOSIS — Z808 Family history of malignant neoplasm of other organs or systems: Secondary | ICD-10-CM | POA: Diagnosis not present

## 2020-10-05 DIAGNOSIS — H93293 Other abnormal auditory perceptions, bilateral: Secondary | ICD-10-CM | POA: Diagnosis not present

## 2021-07-08 DIAGNOSIS — Z6832 Body mass index (BMI) 32.0-32.9, adult: Secondary | ICD-10-CM | POA: Diagnosis not present

## 2021-07-08 DIAGNOSIS — Z01419 Encounter for gynecological examination (general) (routine) without abnormal findings: Secondary | ICD-10-CM | POA: Diagnosis not present

## 2021-07-08 DIAGNOSIS — Z113 Encounter for screening for infections with a predominantly sexual mode of transmission: Secondary | ICD-10-CM | POA: Diagnosis not present

## 2021-07-08 DIAGNOSIS — R350 Frequency of micturition: Secondary | ICD-10-CM | POA: Diagnosis not present

## 2021-07-08 DIAGNOSIS — Z1231 Encounter for screening mammogram for malignant neoplasm of breast: Secondary | ICD-10-CM | POA: Diagnosis not present

## 2021-07-08 DIAGNOSIS — Z124 Encounter for screening for malignant neoplasm of cervix: Secondary | ICD-10-CM | POA: Diagnosis not present

## 2021-09-21 DIAGNOSIS — N939 Abnormal uterine and vaginal bleeding, unspecified: Secondary | ICD-10-CM | POA: Diagnosis not present

## 2021-09-21 DIAGNOSIS — Z3202 Encounter for pregnancy test, result negative: Secondary | ICD-10-CM | POA: Diagnosis not present

## 2021-09-30 DIAGNOSIS — N939 Abnormal uterine and vaginal bleeding, unspecified: Secondary | ICD-10-CM | POA: Diagnosis not present

## 2021-09-30 DIAGNOSIS — Z5309 Procedure and treatment not carried out because of other contraindication: Secondary | ICD-10-CM | POA: Diagnosis not present

## 2021-10-04 DIAGNOSIS — L02426 Furuncle of left lower limb: Secondary | ICD-10-CM | POA: Diagnosis not present

## 2021-10-04 DIAGNOSIS — D2272 Melanocytic nevi of left lower limb, including hip: Secondary | ICD-10-CM | POA: Diagnosis not present

## 2021-10-04 DIAGNOSIS — D2271 Melanocytic nevi of right lower limb, including hip: Secondary | ICD-10-CM | POA: Diagnosis not present

## 2021-10-04 DIAGNOSIS — L02425 Furuncle of right lower limb: Secondary | ICD-10-CM | POA: Diagnosis not present

## 2021-10-04 DIAGNOSIS — L821 Other seborrheic keratosis: Secondary | ICD-10-CM | POA: Diagnosis not present

## 2021-10-04 DIAGNOSIS — L814 Other melanin hyperpigmentation: Secondary | ICD-10-CM | POA: Diagnosis not present

## 2021-10-07 DIAGNOSIS — R9389 Abnormal findings on diagnostic imaging of other specified body structures: Secondary | ICD-10-CM | POA: Diagnosis not present

## 2021-10-07 DIAGNOSIS — Z3202 Encounter for pregnancy test, result negative: Secondary | ICD-10-CM | POA: Diagnosis not present

## 2021-10-07 DIAGNOSIS — N882 Stricture and stenosis of cervix uteri: Secondary | ICD-10-CM | POA: Diagnosis not present

## 2021-10-07 DIAGNOSIS — N939 Abnormal uterine and vaginal bleeding, unspecified: Secondary | ICD-10-CM | POA: Diagnosis not present

## 2021-10-16 DIAGNOSIS — Z8616 Personal history of COVID-19: Secondary | ICD-10-CM

## 2021-10-16 HISTORY — DX: Personal history of COVID-19: Z86.16

## 2021-10-20 DIAGNOSIS — Z86711 Personal history of pulmonary embolism: Secondary | ICD-10-CM | POA: Diagnosis not present

## 2021-11-03 ENCOUNTER — Other Ambulatory Visit: Payer: Self-pay | Admitting: Obstetrics and Gynecology

## 2021-11-08 ENCOUNTER — Encounter (INDEPENDENT_AMBULATORY_CARE_PROVIDER_SITE_OTHER): Payer: Self-pay

## 2021-11-15 NOTE — Progress Notes (Signed)
Pt called today to let us know that she started not feeling well yesterday. Stated she did home covid test and it was positive. She calling to  cancel her lap appt for Monday 11-21-2021 and stated she has called Dr Lanny Cramp office to let them know and rescheduled her surgery that is on 11-24-2021.

## 2021-11-21 ENCOUNTER — Encounter (HOSPITAL_COMMUNITY): Admission: RE | Admit: 2021-11-21 | Payer: BC Managed Care – PPO | Source: Ambulatory Visit

## 2021-11-24 ENCOUNTER — Ambulatory Visit (HOSPITAL_BASED_OUTPATIENT_CLINIC_OR_DEPARTMENT_OTHER)
Admission: RE | Admit: 2021-11-24 | Payer: BC Managed Care – PPO | Source: Home / Self Care | Admitting: Obstetrics and Gynecology

## 2021-11-24 SURGERY — DILATATION AND CURETTAGE /HYSTEROSCOPY
Anesthesia: Choice

## 2021-11-25 ENCOUNTER — Other Ambulatory Visit: Payer: Self-pay | Admitting: Obstetrics and Gynecology

## 2021-12-23 ENCOUNTER — Other Ambulatory Visit: Payer: Self-pay | Admitting: Obstetrics and Gynecology

## 2021-12-26 ENCOUNTER — Other Ambulatory Visit: Payer: Self-pay

## 2021-12-26 ENCOUNTER — Encounter (HOSPITAL_BASED_OUTPATIENT_CLINIC_OR_DEPARTMENT_OTHER): Payer: Self-pay | Admitting: Obstetrics and Gynecology

## 2021-12-26 NOTE — Progress Notes (Signed)
Spoke w/ via phone for pre-op interview--- pt ?Lab needs dos----  cbc, bmp, t&s, urine preg             ?Lab results------ no ?COVID test -----patient states asymptomatic no test needed ?Arrive at ------- 1030 on 12-29-2021 ?NPO after MN NO Solid Food.  Clear liquids from MN until--- 0930 ?Med rec completed ?Medications to take morning of surgery ----- none ?Diabetic medication ----- n/a ?Patient instructed no nail polish to be worn day of surgery ?Patient instructed to bring photo id and insurance card day of surgery ?Patient aware to have Driver (ride ) / caregiver for 24 hours after surgery --- hal ferguson ?Patient Special Instructions ----- n/a ?Pre-Op special Istructions ----- n/a ?Patient verbalized understanding of instructions that were given at this phone interview. ?Patient denies shortness of breath, chest pain, fever, cough at this phone interview.  ?

## 2021-12-28 NOTE — Anesthesia Preprocedure Evaluation (Addendum)
Anesthesia Evaluation  ?Patient identified by MRN, date of birth, ID band ?Patient awake ? ? ? ?Reviewed: ?Allergy & Precautions, NPO status , Patient's Chart, lab work & pertinent test results ? ?History of Anesthesia Complications ?(+) PONV and history of anesthetic complications ? ?Airway ?Mallampati: II ? ?TM Distance: >3 FB ?Neck ROM: Full ? ? ? Dental ?no notable dental hx. ?(+) Teeth Intact, Dental Advisory Given ?  ?Pulmonary ?Current Smoker (Pt Vaped this AM), former smoker, PE (hx of PE on Baby ASA) ?  ?Pulmonary exam normal ?breath sounds clear to auscultation ? ? ? ? ? ? Cardiovascular ?Exercise Tolerance: Good ?Normal cardiovascular exam ?Rhythm:Regular Rate:Normal ? ? ?  ?Neuro/Psych ?  ? GI/Hepatic ?negative GI ROS, Neg liver ROS,   ?Endo/Other  ?negative endocrine ROS ? Renal/GU ?  ? ?  ?Musculoskeletal ? ?(+) Arthritis ,  ? Abdominal ?(+) + obese (BMI 31.1),   ?Peds ? Hematology ? ?(+) Blood dyscrasia, ,   ?Anesthesia Other Findings ?All: tamiflu Latex ? Reproductive/Obstetrics ?negative OB ROS ? ?  ? ? ? ? ? ? ? ? ? ? ? ? ? ?  ?  ? ? ? ? ? ? ? ?Anesthesia Physical ?Anesthesia Plan ? ?ASA: 2 ? ?Anesthesia Plan: General  ? ?Post-op Pain Management: Toradol IV (intra-op)*, Tylenol PO (pre-op)*, Dilaudid IV and Precedex  ? ?Induction: Intravenous ? ?PONV Risk Score and Plan: Midazolam, Scopolamine patch - Pre-op, Dexamethasone, Ondansetron and Treatment may vary due to age or medical condition ? ?Airway Management Planned: LMA ? ?Additional Equipment: None ? ?Intra-op Plan:  ? ?Post-operative Plan:  ? ?Informed Consent: I have reviewed the patients History and Physical, chart, labs and discussed the procedure including the risks, benefits and alternatives for the proposed anesthesia with the patient or authorized representative who has indicated his/her understanding and acceptance.  ? ? ? ?Dental advisory given ? ?Plan Discussed with: CRNA and  Anesthesiologist ? ?Anesthesia Plan Comments:   ? ? ? ? ? ?Anesthesia Quick Evaluation ? ?

## 2021-12-29 ENCOUNTER — Other Ambulatory Visit: Payer: Self-pay

## 2021-12-29 ENCOUNTER — Encounter (HOSPITAL_BASED_OUTPATIENT_CLINIC_OR_DEPARTMENT_OTHER): Payer: Self-pay | Admitting: Obstetrics and Gynecology

## 2021-12-29 ENCOUNTER — Ambulatory Visit (HOSPITAL_BASED_OUTPATIENT_CLINIC_OR_DEPARTMENT_OTHER): Payer: BC Managed Care – PPO | Admitting: Anesthesiology

## 2021-12-29 ENCOUNTER — Encounter (HOSPITAL_BASED_OUTPATIENT_CLINIC_OR_DEPARTMENT_OTHER)
Admission: RE | Disposition: A | Discharge status: Home / Self Care | Payer: Self-pay | Source: Home / Self Care | Attending: Obstetrics and Gynecology

## 2021-12-29 ENCOUNTER — Ambulatory Visit (HOSPITAL_BASED_OUTPATIENT_CLINIC_OR_DEPARTMENT_OTHER)
Admission: RE | Admit: 2021-12-29 | Discharge: 2021-12-29 | Disposition: A | Discharge status: Home / Self Care | Payer: BC Managed Care – PPO | Attending: Obstetrics and Gynecology | Admitting: Obstetrics and Gynecology

## 2021-12-29 DIAGNOSIS — M1990 Unspecified osteoarthritis, unspecified site: Secondary | ICD-10-CM | POA: Diagnosis not present

## 2021-12-29 DIAGNOSIS — R9389 Abnormal findings on diagnostic imaging of other specified body structures: Secondary | ICD-10-CM | POA: Insufficient documentation

## 2021-12-29 DIAGNOSIS — Z86711 Personal history of pulmonary embolism: Secondary | ICD-10-CM | POA: Diagnosis not present

## 2021-12-29 DIAGNOSIS — Z6831 Body mass index (BMI) 31.0-31.9, adult: Secondary | ICD-10-CM | POA: Diagnosis not present

## 2021-12-29 DIAGNOSIS — Z7982 Long term (current) use of aspirin: Secondary | ICD-10-CM | POA: Insufficient documentation

## 2021-12-29 DIAGNOSIS — Z8616 Personal history of COVID-19: Secondary | ICD-10-CM | POA: Insufficient documentation

## 2021-12-29 DIAGNOSIS — N882 Stricture and stenosis of cervix uteri: Secondary | ICD-10-CM | POA: Diagnosis not present

## 2021-12-29 DIAGNOSIS — N939 Abnormal uterine and vaginal bleeding, unspecified: Secondary | ICD-10-CM | POA: Diagnosis not present

## 2021-12-29 DIAGNOSIS — D759 Disease of blood and blood-forming organs, unspecified: Secondary | ICD-10-CM | POA: Diagnosis not present

## 2021-12-29 DIAGNOSIS — E669 Obesity, unspecified: Secondary | ICD-10-CM | POA: Diagnosis not present

## 2021-12-29 DIAGNOSIS — F17290 Nicotine dependence, other tobacco product, uncomplicated: Secondary | ICD-10-CM | POA: Insufficient documentation

## 2021-12-29 HISTORY — DX: Unspecified osteoarthritis, unspecified site: M19.90

## 2021-12-29 HISTORY — DX: Stricture and stenosis of cervix uteri: N88.2

## 2021-12-29 HISTORY — DX: Abnormal findings on diagnostic imaging of other specified body structures: R93.89

## 2021-12-29 HISTORY — DX: Personal history of other malignant neoplasm of skin: Z85.828

## 2021-12-29 HISTORY — DX: Frequency of micturition: R35.0

## 2021-12-29 HISTORY — DX: Hereditary factor VIII deficiency: D66

## 2021-12-29 HISTORY — DX: Other specified postprocedural states: R11.2

## 2021-12-29 HISTORY — DX: Presence of spectacles and contact lenses: Z97.3

## 2021-12-29 HISTORY — DX: Personal history of other infectious and parasitic diseases: Z86.19

## 2021-12-29 HISTORY — PX: HYSTEROSCOPY WITH D & C: SHX1775

## 2021-12-29 HISTORY — DX: Personal history of diseases of the blood and blood-forming organs and certain disorders involving the immune mechanism: Z86.2

## 2021-12-29 LAB — BASIC METABOLIC PANEL
Anion gap: 8 (ref 5–15)
BUN: 13 mg/dL (ref 6–20)
CO2: 25 mmol/L (ref 22–32)
Calcium: 8.9 mg/dL (ref 8.9–10.3)
Chloride: 103 mmol/L (ref 98–111)
Creatinine, Ser: 0.68 mg/dL (ref 0.44–1.00)
GFR, Estimated: >60 mL/min (ref >60)
Glucose, Bld: 84 mg/dL (ref 70–99)
Potassium: 3.7 mmol/L (ref 3.5–5.1)
Sodium: 136 mmol/L (ref 135–145)

## 2021-12-29 LAB — ABO/RH: ABO/RH(D): B POS

## 2021-12-29 LAB — CBC
HCT: 40.6 % (ref 36.0–46.0)
Hemoglobin: 13.8 g/dL (ref 12.0–15.0)
MCH: 30.6 pg (ref 26.0–34.0)
MCHC: 34 g/dL (ref 30.0–36.0)
MCV: 90 fL (ref 80.0–100.0)
Platelets: 357 10*3/uL (ref 150–400)
RBC: 4.51 MIL/uL (ref 3.87–5.11)
RDW: 12.2 % (ref 11.5–15.5)
WBC: 5.3 10*3/uL (ref 4.0–10.5)
nRBC: 0 % (ref 0.0–0.2)

## 2021-12-29 LAB — TYPE AND SCREEN
ABO/RH(D): B POS
Antibody Screen: NEGATIVE

## 2021-12-29 LAB — POCT PREGNANCY, URINE: Preg Test, Ur: NEGATIVE

## 2021-12-29 SURGERY — DILATATION AND CURETTAGE /HYSTEROSCOPY
Anesthesia: General | Site: Vagina

## 2021-12-29 MED ORDER — POVIDONE-IODINE 10 % EX SWAB
2.0000 | Freq: Once | CUTANEOUS | Status: DC
Start: 2021-12-29 — End: 2021-12-30

## 2021-12-29 MED ORDER — LACTATED RINGERS IV SOLN: INTRAVENOUS | Status: DC

## 2021-12-29 MED ORDER — ONDANSETRON HCL 4 MG/2ML IJ SOLN
INTRAMUSCULAR | Status: DC | PRN
  Administered 2021-12-29: 4 mg via INTRAVENOUS

## 2021-12-29 MED ORDER — LIDOCAINE 2% (20 MG/ML) 5 ML SYRINGE
INTRAMUSCULAR | Status: DC | PRN
  Administered 2021-12-29: 50 mg via INTRAVENOUS

## 2021-12-29 MED ORDER — IBUPROFEN 800 MG PO TABS: 800.0000 mg | ORAL_TABLET | Freq: Three times a day (TID) | ORAL | 0 refills | Status: DC | PRN

## 2021-12-29 MED ORDER — MIDAZOLAM HCL 2 MG/2ML IJ SOLN
INTRAMUSCULAR | Status: DC | PRN
  Administered 2021-12-29: 2 mg via INTRAVENOUS

## 2021-12-29 MED ORDER — ONDANSETRON HCL 4 MG/2ML IJ SOLN
INTRAMUSCULAR | Status: AC
  Filled 2021-12-29: qty 2

## 2021-12-29 MED ORDER — PROPOFOL 10 MG/ML IV BOLUS
INTRAVENOUS | Status: DC | PRN
Start: 2021-12-29 — End: 2021-12-29
  Administered 2021-12-29: 150 mg via INTRAVENOUS

## 2021-12-29 MED ORDER — CEFAZOLIN SODIUM-DEXTROSE 2-4 GM/100ML-% IV SOLN
INTRAVENOUS | Status: AC
  Filled 2021-12-29: qty 100

## 2021-12-29 MED ORDER — CEFAZOLIN SODIUM-DEXTROSE 2-4 GM/100ML-% IV SOLN: 2.0000 g | INTRAVENOUS | Status: DC

## 2021-12-29 MED ORDER — LIDOCAINE HCL (PF) 2 % IJ SOLN
INTRAMUSCULAR | Status: AC
  Filled 2021-12-29: qty 5

## 2021-12-29 MED ORDER — KETOROLAC TROMETHAMINE 30 MG/ML IJ SOLN
INTRAMUSCULAR | Status: DC | PRN
  Administered 2021-12-29: 30 mg via INTRAVENOUS

## 2021-12-29 MED ORDER — DEXAMETHASONE SODIUM PHOSPHATE 10 MG/ML IJ SOLN
INTRAMUSCULAR | Status: AC
  Filled 2021-12-29: qty 1

## 2021-12-29 MED ORDER — ACETAMINOPHEN 500 MG PO TABS
1000.0000 mg | ORAL_TABLET | Freq: Once | ORAL | Status: AC
  Administered 2021-12-29: 1000 mg via ORAL

## 2021-12-29 MED ORDER — LIDOCAINE HCL 1 % IJ SOLN
INTRAMUSCULAR | Status: DC | PRN
  Administered 2021-12-29: 10 mL

## 2021-12-29 MED ORDER — KETOROLAC TROMETHAMINE 30 MG/ML IJ SOLN
INTRAMUSCULAR | Status: AC
  Filled 2021-12-29: qty 1

## 2021-12-29 MED ORDER — DEXAMETHASONE SODIUM PHOSPHATE 10 MG/ML IJ SOLN
INTRAMUSCULAR | Status: DC | PRN
  Administered 2021-12-29 (×2): 5 mg via INTRAVENOUS

## 2021-12-29 MED ORDER — FENTANYL CITRATE (PF) 100 MCG/2ML IJ SOLN
INTRAMUSCULAR | Status: AC
Start: 2021-12-29 — End: ?
  Filled 2021-12-29: qty 2

## 2021-12-29 MED ORDER — FENTANYL CITRATE (PF) 100 MCG/2ML IJ SOLN
INTRAMUSCULAR | Status: DC | PRN
  Administered 2021-12-29: 50 ug via INTRAVENOUS
  Administered 2021-12-29 (×2): 25 ug via INTRAVENOUS

## 2021-12-29 MED ORDER — ACETAMINOPHEN 500 MG PO TABS
ORAL_TABLET | ORAL | Status: AC
  Filled 2021-12-29: qty 2

## 2021-12-29 MED ORDER — SODIUM CHLORIDE 0.9 % IR SOLN
Status: DC | PRN
  Administered 2021-12-29: 1000 mL

## 2021-12-29 MED ORDER — PROPOFOL 10 MG/ML IV BOLUS
INTRAVENOUS | Status: AC
  Filled 2021-12-29: qty 20

## 2021-12-29 MED ORDER — MIDAZOLAM HCL 2 MG/2ML IJ SOLN
INTRAMUSCULAR | Status: AC
  Filled 2021-12-29: qty 2

## 2021-12-29 MED ORDER — DEXMEDETOMIDINE (PRECEDEX) IN NS 20 MCG/5ML (4 MCG/ML) IV SYRINGE
PREFILLED_SYRINGE | INTRAVENOUS | Status: AC
  Filled 2021-12-29: qty 5

## 2021-12-29 SURGICAL SUPPLY — 18 items
CATH ROBINSON RED A/P 16FR (CATHETERS) ×1 IMPLANT
DEVICE MYOSURE LITE (MISCELLANEOUS) IMPLANT
DEVICE MYOSURE REACH (MISCELLANEOUS) IMPLANT
DRSG TELFA 3X8 NADH (GAUZE/BANDAGES/DRESSINGS) ×2 IMPLANT
GAUZE 4X4 16PLY ~~LOC~~+RFID DBL (SPONGE) ×2 IMPLANT
GLOVE SURG LTX SZ6.5 (GLOVE) ×2 IMPLANT
GLOVE SURG UNDER POLY LF SZ7 (GLOVE) ×4 IMPLANT
GOWN STRL REUS W/TWL LRG LVL3 (GOWN DISPOSABLE) ×4 IMPLANT
IV NS IRRIG 3000ML ARTHROMATIC (IV SOLUTION) ×1 IMPLANT
KIT PROCEDURE FLUENT (KITS) ×2 IMPLANT
KIT TURNOVER CYSTO (KITS) ×2 IMPLANT
PACK VAGINAL MINOR WOMEN LF (CUSTOM PROCEDURE TRAY) ×2 IMPLANT
PAD DRESSING TELFA 3X8 NADH (GAUZE/BANDAGES/DRESSINGS) ×1 IMPLANT
PAD OB MATERNITY 4.3X12.25 (PERSONAL CARE ITEMS) ×2 IMPLANT
SEAL CERVICAL OMNI LOK (ABLATOR) IMPLANT
SEAL ROD LENS SCOPE MYOSURE (ABLATOR) ×2 IMPLANT
TOWEL OR 17X26 10 PK STRL BLUE (TOWEL DISPOSABLE) ×3 IMPLANT
UNDERPAD 30X36 HEAVY ABSORB (UNDERPADS AND DIAPERS) ×2 IMPLANT

## 2021-12-29 NOTE — H&P (Signed)
Deanna Herman is an 46 y.o. female presenting for scheduled surgery. ? ?Patient is a 76Y G23P0 female with AUB presenting for diagnostic hysteroscopy, D&C ? ?Has been having prolonged irregular bleeding since October 2022 ?Patient had initial evaluation in the office on 09/29/21 with TVUS showing endometrium is thickened and heterogeneous with small cystic spaces throughout, internal vascularity noted, no discrete focal abnormality noted 21m. ?-Attempted EBx but unable to tolerate at that time ?-Given Cytotec and planned for return with paracervical block and UKoreaguidance ? ?Patient returned to office on 10/07/21 for attempted endometrial sampling but unable to tolerate exam and had significant cervical stenosis noted and was consented for outpatient procedure under anesthesia ? ?Patient had originally been scheduled for surgery last month but had to be cancelled and reschedule due to patient having COVID.  ? ?Today patient reports continued off and on bleeding with bleeding lighter today ?Up to date on AEX and had a normal Pap in Sept 2022 ? ?PMH significant for PE on OCP many years ago. Has been on baby aspirin only for prophylaxis. Was advised by PCP to start prophylactic Lovenox starting on POD1 for 10 days. Has Rx filled, Lovenox at home and scheduled for RN visit in office tomorrow for Lovenox teaching ? ? ?Menstrual History: ? ?No LMP recorded. (Menstrual status: Irregular Periods). ?  ? ?Past Medical History:  ?Diagnosis Date  ? Arthritis   ? Cervical stenosis (uterine cervix)   ? Factor VIII deficiency ([place]   ? per pt after PE 01/ 2008 work-up done and found to have factor VIII,  takes ASA '81mg'$  daily  ? Frequency of urination   ? History of anemia   ? History of cervical dysplasia 04/2007  ? s/p  cervical cone bx/ LEEP for  CIN I  (done in gyn office)  ? History of COVID-19 10/2021  ? per pt mild symptoms that resolved  ? History of herpes genitalis   ? History of nonmelanoma skin cancer   ? History of  pulmonary embolism 10/20/2006  ? admission in epic pulm artery branchs to [facility],  completed coumadin,  (12-26-2021 pt stated provoked by taking birth control pill, smoker and recently travelled;  stated no clots prior and none since)  ? PONV (postoperative nausea and vomiting)   ? Thickened endometrium   ? Wears contact lenses   ? ? ?Past Surgical History:  ?Procedure Laterality Date  ? INGUINAL HERNIA REPAIR Bilateral   ? child  ? THYROGLOSSAL DUCT CYST    ? per pt x5  as child last two 1979 and 1987  ? TONSILLECTOMY    ? age 46 ? ? ?History reviewed. No pertinent family history. ? ?Social History:  reports that she quit smoking about 4 years ago. Her smoking use included cigarettes. She has never used smokeless tobacco. She reports current alcohol use. She reports that she does not use drugs. ? ?Allergies:  ?Allergies  ?Allergen Reactions  ? Latex Hives and Itching  ? Tamiflu [Oseltamivir Phosphate] Hives  ? ? ?Medications Prior to Admission  ?Medication Sig Dispense Refill Last Dose  ? aspirin EC 81 MG tablet Take 81 mg by mouth daily. Swallow whole.   12/29/2021  ? Calcium Citrate-Vitamin D (CALCIUM + D PO) Take by mouth daily.   Past Month  ? Cholecalciferol (VITAMIN D-3 PO) Take by mouth daily.   Past Month  ? clindamycin-benzoyl peroxide (BENZACLIN) gel Apply topically daily.   12/29/2021  ? Multiple Vitamins-Minerals (MULTIVITAMIN WOMEN PO) Take by  mouth daily at 12 noon.   Past Month  ? tazarotene (TAZORAC) 0.05 % cream Apply topically at bedtime.   12/28/2021  ? ? ?Review of Systems  ?All other systems reviewed and are negative. ? ?Blood pressure 132/78, pulse 72, temperature (!) 97.4 ?F (36.3 ?C), temperature source Oral, resp. rate 20, height '5\' 7"'$  (1.702 m), weight 90.1 kg, SpO2 100 %. ?Physical Exam ?Vitals reviewed.  ?Constitutional:   ?   Appearance: She is normal weight.  ?HENT:  ?   Head: Normocephalic.  ?Cardiovascular:  ?   Rate and Rhythm: Normal rate.  ?Pulmonary:  ?   Effort: Pulmonary effort is  normal.  ?Abdominal:  ?   General: Abdomen is flat.  ?   Palpations: Abdomen is soft.  ?Genitourinary: ?   General: Normal vulva.  ?Musculoskeletal:     ?   General: Normal range of motion.  ?   Cervical back: Normal range of motion.  ?Skin: ?   General: Skin is dry.  ?Neurological:  ?   General: No focal deficit present.  ?   Mental Status: She is alert and oriented to person, place, and time.  ?Psychiatric:     ?   Mood and Affect: Mood normal.     ?   Behavior: Behavior normal.  ? ? ?Results for orders placed or performed during the hospital encounter of 12/29/21 (from the past 24 hour(s))  ?Pregnancy, urine POC     Status: None  ? Collection Time: 12/29/21 10:39 AM  ?Result Value Ref Range  ? Preg Test, Ur NEGATIVE NEGATIVE  ? ? ?No results found. ? ?Assessment/Plan: ? ?45Y G52P0020 female with AUB and thickened endometrial lining presenting for hysteroscopy with D&C procedure under anesthesia due to cervical stenosis and inability to tolerate in office procedure ? ?Patient has been consented in the office and again in the preoperative holding unit for the above listed procedure. She is aware of the risks of surgery including but not limited to bleeding, infection, damage to surrounding organs, uterine perforation, and inherit risks of anesthesia. She has verbalized good understanding of risks, questions answered to her satisfaction, and consents signed.  ? ?-NPO ?-No antibiotics indicated ?-SCD VTE ppx, hx of OCP related PE planning for Lovenox ppx to begin POD1 x10d per PCP recommendations ?-UPT NEG ?-Routine preop/postop care ?-Anticipate DC home today. DC instructions reviewed and plan for postop follow up in the office in 2 weeks ? ?Deanna Herman ?12/29/2021, 11:13 AM ? ?

## 2021-12-29 NOTE — Op Note (Signed)
Briony Jackowski ?1976/01/22 ?[id number] ? ?Operative Note ? ?PROCEDURE: diagnostic hysteroscopy, dilation and curettage  ? ?PRE-OPERATIVE DIAGNOSIS: AUB, thickened endometrium ? ?POST-OPERATIVE DIAGNOSIS: AUB, thickened endometrium ? ?SURGEON: Dr. Langley Gauss, DO ? ?ASSISTANT: N/A ? ?FINDINGS: normal appearing external female genitalia without lesions, normal appearing ectocervix without lesions, stenotic cervical os, uterine cavity with bilateral tubal ostia visualized, thickened endometrium but no evidence of discrete polyps or fibroids appreciated ? ?SPECIMENS: endometrial curettings ? ?EBL: minimal ? ?FLUIDS: 400 mL ? ?COMPLICATIONS: None ? ?PROCEDURE IN DETAIL:  ? ?After the patient was appropriately consented in the holding area, she was taken to the operating room where general anesthesia was administered without complications. The patient was placed in the dorsal lithotomy position and prepped and draped in the usual sterile fashion. An appropriate time out was performed that verified the correct patient, procedure, and surgical team.  ? ?A sterile speculum was inserted into the vagina and the cervix was visualized. A single tooth tenaculum was used to grasp the anterior lip of the cervix. A paracervical block was obtained using 1% plain lidocaine injecting a total of 10 cc divided between the 4 and 8 o'clock positions. The cervix was noted to be stenotic and an os finder used to assist in dilation. The cervix was sequentially dilated up to a size 15 french. The hysteroscopy was introduced through the cervix and advanced to the uterine fundus. The uterine cavity distension was obtained without difficulty and the findings were noted as described above. Given no evidence of polyps or fibroids, no operative hysteroscopic resection indicated. The hysteroscope was removed. A thorough sharp curetting was performed in a 360 degree fashion and specimens collected on a Telfa. All specimens were sent for final  pathology. A final look performed with the hysteroscope that confirmed complete endometrial sampling. The tenaculum was removed and excellent hemostasis was appreciated. Speculum was removed. Patient tolerated the procedure well and was taken to the recovery room in stable condition. All instrument and lap counts were correct. ? ?Carisha Kantor A Jaan Fischel ?12/29/21 ?1:23 PM ? ? ?

## 2021-12-29 NOTE — Discharge Instructions (Addendum)
? ? ? ? ?  Post Anesthesia Home Care Instructions ? ?Activity: ?Get plenty of rest for the remainder of the day. A responsible individual must stay with you for 24 hours following the procedure.  ?For the next 24 hours, DO NOT: ?-Drive a car ?-Paediatric nurse ?-Drink alcoholic beverages ?-Take any medication unless instructed by your physician ?-Make any legal decisions or sign important papers. ? ?Meals: ?Start with liquid foods such as gelatin or soup. Progress to regular foods as tolerated. Avoid greasy, spicy, heavy foods. If nausea and/or vomiting occur, drink only clear liquids until the nausea and/or vomiting subsides. Call your physician if vomiting continues. ? ?Special Instructions/Symptoms: ?Your throat may feel dry or sore from the anesthesia or the breathing tube placed in your throat during surgery. If this causes discomfort, gargle with warm salt water. The discomfort should disappear within 24 hours. ? ?   Do not take any Tylenol until after 4:45 pm today if needed. ?

## 2021-12-29 NOTE — Transfer of Care (Signed)
Immediate Anesthesia Transfer of Care Note ? ?Patient: Senovia Gauer ? ?Procedure(s) Performed: Procedure(s) (LRB): ?DILATATION AND CURETTAGE /HYSTEROSCOPY (N/A) ? ?Patient Location: PACU ? ?Anesthesia Type: General ? ?Level of Consciousness: awake, oriented, sedated and patient cooperative ? ?Airway & Oxygen Therapy: Patient Spontanous Breathing and Patient connected to face mask oxygen ? ?Post-op Assessment: Report given to PACU RN and Post -op Vital signs reviewed and stable ? ?Post vital signs: Reviewed and stable ? ?Complications: No apparent anesthesia complications ?Last Vitals:  ?Vitals Value Taken Time  ?BP 129/82 12/29/21 1305  ?Temp    ?Pulse 63 12/29/21 1309  ?Resp 11 12/29/21 1309  ?SpO2 100 % 12/29/21 1309  ?Vitals shown include unvalidated device data. ? ?Last Pain:  ?Vitals:  ? 12/29/21 1038  ?TempSrc: Oral  ?PainSc: 3   ?   ? ?  ? ?Complications: No notable events documented. ?

## 2021-12-29 NOTE — Anesthesia Postprocedure Evaluation (Signed)
Anesthesia Post Note ? ?Patient: Deanna Herman ? ?Procedure(s) Performed: DILATATION AND CURETTAGE /HYSTEROSCOPY (Vagina ) ? ?  ? ?Patient location during evaluation: PACU ?Anesthesia Type: General ?Level of consciousness: awake and alert ?Pain management: pain level controlled ?Vital Signs Assessment: post-procedure vital signs reviewed and stable ?Respiratory status: spontaneous breathing, nonlabored ventilation, respiratory function stable and patient connected to nasal cannula oxygen ?Cardiovascular status: blood pressure returned to baseline and stable ?Postop Assessment: no apparent nausea or vomiting ?Anesthetic complications: no ? ? ?No notable events documented. ? ?Last Vitals:  ?Vitals:  ? 12/29/21 1330 12/29/21 1410  ?BP: 110/81 112/73  ?Pulse: (!) 59 (!) 54  ?Resp: 10 16  ?Temp:  (!) 36.4 ?C  ?SpO2: 99% 100%  ?  ?Last Pain:  ?Vitals:  ? 12/29/21 1410  ?TempSrc: Axillary  ?PainSc: 3   ? ? ?  ?  ?  ?  ?  ?  ? ?Barnet Glasgow ? ? ? ? ?

## 2021-12-29 NOTE — Anesthesia Procedure Notes (Signed)
Procedure Name: Intubation ?Date/Time: 12/29/2021 12:27 PM ?Performed by: Suan Halter, CRNA ?Pre-anesthesia Checklist: Patient identified, Emergency Drugs available, Suction available and Patient being monitored ?Patient Re-evaluated:Patient Re-evaluated prior to induction ?Oxygen Delivery Method: Circle system utilized ?Preoxygenation: Pre-oxygenation with 100% oxygen ?Induction Type: IV induction ?Ventilation: Mask ventilation without difficulty ?Tube type: Oral ?Number of attempts: 1 ?Airway Equipment and Method: Stylet and Oral airway ?Placement Confirmation: ETT inserted through vocal cords under direct vision, positive ETCO2 and breath sounds checked- equal and bilateral ?Tube secured with: Tape ?Dental Injury: Teeth and Oropharynx as per pre-operative assessment  ? ? ? ? ?

## 2021-12-30 ENCOUNTER — Encounter (HOSPITAL_BASED_OUTPATIENT_CLINIC_OR_DEPARTMENT_OTHER): Payer: Self-pay | Admitting: Obstetrics and Gynecology

## 2021-12-30 DIAGNOSIS — Z86711 Personal history of pulmonary embolism: Secondary | ICD-10-CM | POA: Diagnosis not present

## 2021-12-30 LAB — SURGICAL PATHOLOGY

## 2022-02-02 DIAGNOSIS — L02426 Furuncle of left lower limb: Secondary | ICD-10-CM | POA: Diagnosis not present

## 2022-02-02 DIAGNOSIS — B351 Tinea unguium: Secondary | ICD-10-CM | POA: Diagnosis not present

## 2022-02-02 DIAGNOSIS — L02425 Furuncle of right lower limb: Secondary | ICD-10-CM | POA: Diagnosis not present

## 2022-02-02 DIAGNOSIS — B353 Tinea pedis: Secondary | ICD-10-CM | POA: Diagnosis not present

## 2022-08-01 DIAGNOSIS — Z6832 Body mass index (BMI) 32.0-32.9, adult: Secondary | ICD-10-CM | POA: Diagnosis not present

## 2022-08-01 DIAGNOSIS — Z01419 Encounter for gynecological examination (general) (routine) without abnormal findings: Secondary | ICD-10-CM | POA: Diagnosis not present

## 2022-08-01 DIAGNOSIS — Z1231 Encounter for screening mammogram for malignant neoplasm of breast: Secondary | ICD-10-CM | POA: Diagnosis not present

## 2022-08-02 ENCOUNTER — Other Ambulatory Visit: Payer: Self-pay | Admitting: Obstetrics and Gynecology

## 2022-08-14 ENCOUNTER — Encounter (HOSPITAL_BASED_OUTPATIENT_CLINIC_OR_DEPARTMENT_OTHER): Payer: Self-pay | Admitting: Obstetrics and Gynecology

## 2022-08-14 NOTE — Progress Notes (Signed)
Spoke w/ via phone for pre-op interview--- pt Lab needs dos----  cbc, bmp, t&s, urine preg             Lab results------ no COVID test -----patient states asymptomatic no test needed Arrive at ------- 0730 on 08-18-2022 NPO after MN NO Solid Food.  Clear liquids from MN until--- 0630 Med rec completed Medications to take morning of surgery ----- camila Diabetic medication ----- n/a Patient instructed no nail polish to be worn day of surgery Patient instructed to bring photo id and insurance card day of surgery Patient aware to have Driver (ride ) / caregiver  for 24 hours after surgery -- sig other, hal Patient Special Instructions ----- n/a Pre-Op special Istructions ----- n/a Patient verbalized understanding of instructions that were given at this phone interview. Patient denies shortness of breath, chest pain, fever, cough at this phone interview.

## 2022-08-17 NOTE — Anesthesia Preprocedure Evaluation (Addendum)
Anesthesia Evaluation  Patient identified by MRN, date of birth, ID band  Reviewed: Allergy & Precautions, NPO status , Patient's Chart, lab work & pertinent test results  History of Anesthesia Complications (+) PONV and history of anesthetic complications  Airway Mallampati: II  TM Distance: >3 FB Neck ROM: Full    Dental no notable dental hx. (+) Teeth Intact, Dental Advisory Given   Pulmonary Current Smoker (vaped this AM) and Patient abstained from smoking., former smoker, PE (on Baby ASA)   Pulmonary exam normal breath sounds clear to auscultation       Cardiovascular Normal cardiovascular exam Rhythm:Regular Rate:Normal     Neuro/Psych    GI/Hepatic Neg liver ROS,,,  Endo/Other    Renal/GU      Musculoskeletal  (+) Arthritis ,    Abdominal   Peds  Hematology   Anesthesia Other Findings All Latex Tamiflu  Reproductive/Obstetrics                             Anesthesia Physical Anesthesia Plan  ASA: 2  Anesthesia Plan: General   Post-op Pain Management: Precedex, Tylenol PO (pre-op)* and Toradol IV (intra-op)*   Induction: Intravenous  PONV Risk Score and Plan: 4 or greater and Treatment may vary due to age or medical condition, Midazolam, Ondansetron and Dexamethasone  Airway Management Planned: LMA  Additional Equipment: None  Intra-op Plan:   Post-operative Plan:   Informed Consent: I have reviewed the patients History and Physical, chart, labs and discussed the procedure including the risks, benefits and alternatives for the proposed anesthesia with the patient or authorized representative who has indicated his/her understanding and acceptance.     Dental advisory given  Plan Discussed with: CRNA and Surgeon  Anesthesia Plan Comments:         Anesthesia Quick Evaluation

## 2022-08-18 ENCOUNTER — Ambulatory Visit (HOSPITAL_BASED_OUTPATIENT_CLINIC_OR_DEPARTMENT_OTHER)
Admission: RE | Admit: 2022-08-18 | Discharge: 2022-08-18 | Disposition: A | Discharge status: Home / Self Care | Payer: BC Managed Care – PPO | Attending: Obstetrics and Gynecology | Admitting: Obstetrics and Gynecology

## 2022-08-18 ENCOUNTER — Encounter (HOSPITAL_BASED_OUTPATIENT_CLINIC_OR_DEPARTMENT_OTHER): Payer: Self-pay | Admitting: Obstetrics and Gynecology

## 2022-08-18 ENCOUNTER — Ambulatory Visit (HOSPITAL_BASED_OUTPATIENT_CLINIC_OR_DEPARTMENT_OTHER): Payer: BC Managed Care – PPO | Admitting: Anesthesiology

## 2022-08-18 ENCOUNTER — Other Ambulatory Visit: Payer: Self-pay

## 2022-08-18 ENCOUNTER — Encounter (HOSPITAL_BASED_OUTPATIENT_CLINIC_OR_DEPARTMENT_OTHER)
Admission: RE | Disposition: A | Discharge status: Home / Self Care | Payer: Self-pay | Source: Home / Self Care | Attending: Obstetrics and Gynecology

## 2022-08-18 DIAGNOSIS — N882 Stricture and stenosis of cervix uteri: Secondary | ICD-10-CM | POA: Diagnosis not present

## 2022-08-18 DIAGNOSIS — F17290 Nicotine dependence, other tobacco product, uncomplicated: Secondary | ICD-10-CM | POA: Insufficient documentation

## 2022-08-18 DIAGNOSIS — N939 Abnormal uterine and vaginal bleeding, unspecified: Secondary | ICD-10-CM | POA: Diagnosis not present

## 2022-08-18 DIAGNOSIS — M1990 Unspecified osteoarthritis, unspecified site: Secondary | ICD-10-CM | POA: Insufficient documentation

## 2022-08-18 DIAGNOSIS — Z01818 Encounter for other preprocedural examination: Secondary | ICD-10-CM

## 2022-08-18 HISTORY — DX: Abnormal uterine and vaginal bleeding, unspecified: N93.9

## 2022-08-18 HISTORY — PX: DILITATION & CURRETTAGE/HYSTROSCOPY WITH NOVASURE ABLATION: SHX5568

## 2022-08-18 LAB — CBC
HCT: 46.1 % — ABNORMAL HIGH (ref 36.0–46.0)
Hemoglobin: 15.2 g/dL — ABNORMAL HIGH (ref 12.0–15.0)
MCH: 30.1 pg (ref 26.0–34.0)
MCHC: 33 g/dL (ref 30.0–36.0)
MCV: 91.3 fL (ref 80.0–100.0)
Platelets: 308 10*3/uL (ref 150–400)
RBC: 5.05 MIL/uL (ref 3.87–5.11)
RDW: 12 % (ref 11.5–15.5)
WBC: 6.7 10*3/uL (ref 4.0–10.5)
nRBC: 0 % (ref 0.0–0.2)

## 2022-08-18 LAB — TYPE AND SCREEN
ABO/RH(D): B POS
Antibody Screen: NEGATIVE

## 2022-08-18 LAB — BASIC METABOLIC PANEL
Anion gap: 7 (ref 5–15)
BUN: 13 mg/dL (ref 6–20)
CO2: 25 mmol/L (ref 22–32)
Calcium: 9 mg/dL (ref 8.9–10.3)
Chloride: 106 mmol/L (ref 98–111)
Creatinine, Ser: 0.64 mg/dL (ref 0.44–1.00)
GFR, Estimated: >60 mL/min (ref >60)
Glucose, Bld: 96 mg/dL (ref 70–99)
Potassium: 4.2 mmol/L (ref 3.5–5.1)
Sodium: 138 mmol/L (ref 135–145)

## 2022-08-18 LAB — POCT PREGNANCY, URINE: Preg Test, Ur: NEGATIVE

## 2022-08-18 SURGERY — DILATATION & CURETTAGE/HYSTEROSCOPY WITH NOVASURE ABLATION
Anesthesia: General | Site: Uterus

## 2022-08-18 MED ORDER — SCOPOLAMINE 1 MG/3DAYS TD PT72
MEDICATED_PATCH | TRANSDERMAL | Status: AC
  Filled 2022-08-18: qty 1

## 2022-08-18 MED ORDER — ONDANSETRON HCL 4 MG/2ML IJ SOLN
INTRAMUSCULAR | Status: DC | PRN
  Administered 2022-08-18: 4 mg via INTRAVENOUS

## 2022-08-18 MED ORDER — DEXMEDETOMIDINE HCL IN NACL 80 MCG/20ML IV SOLN
INTRAVENOUS | Status: DC | PRN
  Administered 2022-08-18: 8 ug via BUCCAL

## 2022-08-18 MED ORDER — OXYCODONE HCL 5 MG/5ML PO SOLN: 5.0000 mg | Freq: Once | ORAL | Status: AC | PRN

## 2022-08-18 MED ORDER — ONDANSETRON HCL 4 MG/2ML IJ SOLN: 4.0000 mg | Freq: Once | INTRAMUSCULAR | Status: DC | PRN

## 2022-08-18 MED ORDER — MIDAZOLAM HCL 2 MG/2ML IJ SOLN
INTRAMUSCULAR | Status: AC
  Filled 2022-08-18: qty 2

## 2022-08-18 MED ORDER — OXYCODONE HCL 5 MG PO TABS
ORAL_TABLET | ORAL | Status: AC
  Filled 2022-08-18: qty 1

## 2022-08-18 MED ORDER — KETOROLAC TROMETHAMINE 30 MG/ML IJ SOLN
INTRAMUSCULAR | Status: DC | PRN
  Administered 2022-08-18: 30 mg via INTRAVENOUS

## 2022-08-18 MED ORDER — LIDOCAINE 2% (20 MG/ML) 5 ML SYRINGE
INTRAMUSCULAR | Status: DC | PRN
  Administered 2022-08-18: 80 mg via INTRAVENOUS

## 2022-08-18 MED ORDER — POVIDONE-IODINE 10 % EX SWAB: 2.0000 | Freq: Once | CUTANEOUS | Status: DC

## 2022-08-18 MED ORDER — SODIUM CHLORIDE 0.9 % IR SOLN
Status: DC | PRN
  Administered 2022-08-18: 188 mL

## 2022-08-18 MED ORDER — KETOROLAC TROMETHAMINE 30 MG/ML IJ SOLN: 30.0000 mg | Freq: Once | INTRAMUSCULAR | Status: DC | PRN

## 2022-08-18 MED ORDER — FENTANYL CITRATE (PF) 100 MCG/2ML IJ SOLN
INTRAMUSCULAR | Status: DC | PRN
  Administered 2022-08-18 (×2): 50 ug via INTRAVENOUS

## 2022-08-18 MED ORDER — HYDROMORPHONE HCL 1 MG/ML IJ SOLN
0.2500 mg | INTRAMUSCULAR | Status: DC | PRN
  Administered 2022-08-18 (×4): 0.5 mg via INTRAVENOUS

## 2022-08-18 MED ORDER — LIDOCAINE HCL 1 % IJ SOLN
INTRAMUSCULAR | Status: DC | PRN
  Administered 2022-08-18: 10 mL

## 2022-08-18 MED ORDER — MIDAZOLAM HCL 2 MG/2ML IJ SOLN
INTRAMUSCULAR | Status: DC | PRN
  Administered 2022-08-18: 2 mg via INTRAVENOUS

## 2022-08-18 MED ORDER — HYDROMORPHONE HCL 1 MG/ML IJ SOLN
INTRAMUSCULAR | Status: AC
  Filled 2022-08-18: qty 1

## 2022-08-18 MED ORDER — DEXAMETHASONE SODIUM PHOSPHATE 10 MG/ML IJ SOLN
INTRAMUSCULAR | Status: DC | PRN
  Administered 2022-08-18: 5 mg via INTRAVENOUS

## 2022-08-18 MED ORDER — LIDOCAINE HCL (PF) 2 % IJ SOLN
INTRAMUSCULAR | Status: AC
  Filled 2022-08-18: qty 5

## 2022-08-18 MED ORDER — DEXAMETHASONE SODIUM PHOSPHATE 10 MG/ML IJ SOLN
INTRAMUSCULAR | Status: AC
  Filled 2022-08-18: qty 1

## 2022-08-18 MED ORDER — FENTANYL CITRATE (PF) 100 MCG/2ML IJ SOLN
INTRAMUSCULAR | Status: AC
  Filled 2022-08-18: qty 2

## 2022-08-18 MED ORDER — LIDOCAINE HCL 1 % IJ SOLN
INTRAMUSCULAR | Status: AC
  Filled 2022-08-18: qty 20

## 2022-08-18 MED ORDER — OXYCODONE HCL 5 MG PO TABS
5.0000 mg | ORAL_TABLET | Freq: Once | ORAL | Status: AC | PRN
  Administered 2022-08-18: 5 mg via ORAL

## 2022-08-18 MED ORDER — LACTATED RINGERS IV SOLN: INTRAVENOUS | Status: DC

## 2022-08-18 MED ORDER — ONDANSETRON HCL 4 MG/2ML IJ SOLN
INTRAMUSCULAR | Status: AC
  Filled 2022-08-18: qty 2

## 2022-08-18 MED ORDER — SCOPOLAMINE 1 MG/3DAYS TD PT72
1.0000 | MEDICATED_PATCH | TRANSDERMAL | Status: DC
  Administered 2022-08-18: 1.5 mg via TRANSDERMAL

## 2022-08-18 MED ORDER — PROPOFOL 10 MG/ML IV BOLUS
INTRAVENOUS | Status: AC
  Filled 2022-08-18: qty 20

## 2022-08-18 MED ORDER — KETOROLAC TROMETHAMINE 30 MG/ML IJ SOLN
INTRAMUSCULAR | Status: AC
  Filled 2022-08-18: qty 1

## 2022-08-18 MED ORDER — DEXMEDETOMIDINE HCL IN NACL 80 MCG/20ML IV SOLN
INTRAVENOUS | Status: AC
  Filled 2022-08-18: qty 20

## 2022-08-18 MED ORDER — PROPOFOL 10 MG/ML IV BOLUS
INTRAVENOUS | Status: DC | PRN
  Administered 2022-08-18: 180 mg via INTRAVENOUS
  Administered 2022-08-18: 20 mg via INTRAVENOUS

## 2022-08-18 SURGICAL SUPPLY — 13 items
ABLATOR SURESOUND NOVASURE (ABLATOR) ×1 IMPLANT
CATH ROBINSON RED A/P 16FR (CATHETERS) ×1 IMPLANT
DRSG TELFA 3X8 NADH STRL (GAUZE/BANDAGES/DRESSINGS) ×1 IMPLANT
GAUZE 4X4 16PLY ~~LOC~~+RFID DBL (SPONGE) ×1 IMPLANT
GLOVE BIOGEL PI IND STRL 7.0 (GLOVE) ×2 IMPLANT
GLOVE ECLIPSE 6.5 STRL STRAW (GLOVE) ×1 IMPLANT
GOWN STRL REUS W/TWL LRG LVL3 (GOWN DISPOSABLE) ×2 IMPLANT
KIT PROCEDURE FLUENT (KITS) ×1 IMPLANT
KIT TURNOVER CYSTO (KITS) ×1 IMPLANT
PACK VAGINAL MINOR WOMEN LF (CUSTOM PROCEDURE TRAY) ×1 IMPLANT
PAD OB MATERNITY 4.3X12.25 (PERSONAL CARE ITEMS) ×1 IMPLANT
TOWEL OR 17X26 10 PK STRL BLUE (TOWEL DISPOSABLE) ×2 IMPLANT
UNDERPAD 30X36 HEAVY ABSORB (UNDERPADS AND DIAPERS) ×1 IMPLANT

## 2022-08-18 NOTE — H&P (Signed)
Deanna Herman is an 46 y.o. female presenting for scheduled surgery.   Patient is a 64Y G35P0 female with AUB presenting for hysteroscopy, D&C, with Novasure endometrial ablation   History of irregular bleeding since October 2022 Patient had initial evaluation in the office on 09/29/21 with TVUS showing endometrium is thickened and heterogeneous with small cystic spaces throughout, internal vascularity noted, no discrete focal abnormality noted 21m. -Attempted EBx but unable to tolerate at that time -Given Cytotec and planned for return with paracervical block and UKoreaguidance but unable to tolerate  12/29/21 [facility]-Uncomplicated surgery: uterine cavity with bilateral tubal ostia visualized, thickened endometrium but no evidence of discrete polyps or fibroids appreciated  -Surgical Pathology: Benign proliferative endometrium  Patient seen in office for AEX September 2023 reporting return of irregular bleeding, unable to tolerate POP  Husband with vasectomy for contraception Not ready for full hysterectomy   PMH significant for PE on OCP many years ago. Has been on baby aspirin only for prophylaxis. Was advised by PCP to start prophylactic Lovenox starting on POD1 for 10 days. Has Rx filled, Lovenox at home   Menstrual History:  No LMP recorded. (Menstrual status: Irregular Periods).    Past Medical History:  Diagnosis Date   Abnormal uterine bleeding (AUB)    Arthritis    Cervical stenosis (uterine cervix)    [place]    per pt after PE 01/ 2008 work-up done and found to have factor VIII,  takes ASA '81mg'$  daily   Frequency of urination    History of anemia    History of cervical dysplasia 04/2007   s/p  cervical cone bx/ LEEP for  CIN I  (done in gyn office)   History of COVID-19 10/2021   per pt mild symptoms that resolved   History of herpes genitalis    History of nonmelanoma skin cancer    History of  pulmonary embolism 10/20/2006   admission in epic pulm artery branchs to [facility],  completed coumadin,  (12-26-2021 pt stated provoked by taking birth control pill, smoker and recently travelled;  stated no clots prior and none since)   PONV (postoperative nausea and vomiting)    Wears contact lenses     Past Surgical History:  Procedure Laterality Date   HYSTEROSCOPY WITH D & C N/A 12/29/2021   Procedure: DILATATION AND CURETTAGE /HYSTEROSCOPY;  Surgeon: LArmandina Stammer DO;  [facility]  Service: Gynecology;  Laterality: N/A;   INGUINAL HERNIA REPAIR Bilateral    child   THYROGLOSSAL DUCT CYST     per pt x5  as child last two 1979 and 1[place]    age 46   History reviewed. No pertinent family history.  Social History:  reports that she quit smoking about 4 years ago. Her smoking use included cigarettes. She has never used smokeless tobacco. She reports current alcohol use. She reports that she does not use drugs.  Allergies:  Allergies  Allergen Reactions   Latex Hives and Itching   Tamiflu [Oseltamivir Phosphate] Hives    Medications Prior to Admission  Medication Sig Dispense Refill Last Dose   aspirin EC 81 MG tablet Take 81 mg by mouth daily. Swallow whole.   08/18/2022 at 0630   Calcium Citrate-Vitamin D (CALCIUM + D PO) Take by mouth daily.   Past Month   Cholecalciferol (VITAMIN D-3 PO) Take by mouth daily.   Past Month  clindamycin-benzoyl peroxide (BENZACLIN) gel Apply topically daily.   08/17/2022   misoprostol (CYTOTEC) 200 MCG tablet Place 400 mcg vaginally once.   08/17/2022 at 2130   Multiple Vitamins-Minerals (MULTIVITAMIN WOMEN PO) Take by mouth daily at 12 noon.   Past Month   norethindrone (CAMILA) 0.35 MG tablet Take 1 tablet by mouth daily.   08/18/2022 at 0630   tazarotene (TAZORAC) 0.05 % cream Apply topically at bedtime.   08/17/2022    Review of Systems  All other systems reviewed and are negative.   Blood pressure  131/60, pulse 66, temperature 98 F (36.7 C), temperature source Oral, resp. rate 16, height '5\' 6"'$  (1.676 m), weight 91.9 kg, SpO2 100 %. Physical Exam Vitals reviewed.  Constitutional:      Appearance: Normal appearance. She is normal weight.  HENT:     Head: Normocephalic.  Cardiovascular:     Rate and Rhythm: Normal rate.  Pulmonary:     Effort: Pulmonary effort is normal.  Abdominal:     Palpations: Abdomen is soft.     Tenderness: There is no abdominal tenderness.  Genitourinary:    General: Normal vulva.  Musculoskeletal:        General: Normal range of motion.     Cervical back: Normal range of motion.  Skin:    General: Skin is warm and dry.  Neurological:     General: No focal deficit present.     Mental Status: She is alert and oriented to person, place, and time.  Psychiatric:        Mood and Affect: Mood normal.        Behavior: Behavior normal.     Results for orders placed or performed during the hospital encounter of 08/18/22 (from the past 24 hour(s))  Pregnancy, urine POC     Status: None   Collection Time: 08/18/22  7:44 AM  Result Value Ref Range   Preg Test, Ur NEGATIVE NEGATIVE  CBC     Status: Abnormal   Collection Time: 08/18/22  8:44 AM  Result Value Ref Range   WBC 6.7 4.0 - 10.5 K/uL   RBC 5.05 3.87 - 5.11 MIL/uL   Hemoglobin 15.2 (H) 12.0 - 15.0 g/dL   HCT 46.1 (H) 36.0 - 46.0 %   MCV 91.3 80.0 - 100.0 fL   MCH 30.1 26.0 - 34.0 pg   MCHC 33.0 30.0 - 36.0 g/dL   RDW 12.0 11.5 - 15.5 %   Platelets 308 150 - 400 K/uL   nRBC 0.0 0.0 - 0.2 %  Type and screen     Status: None (Preliminary result)   Collection Time: 08/18/22  8:44 AM  Result Value Ref Range   ABO/RH(D) PENDING    Antibody Screen PENDING    Sample Expiration      08/21/2022,2359 Performed at [facility], [place] [address], [place], [place] [address]   Basic metabolic panel     Status: None   Collection Time: 08/18/22  8:44 AM  Result Value Ref Range    Sodium 138 135 - 145 mmol/L   Potassium 4.2 3.5 - 5.1 mmol/L   Chloride 106 98 - 111 mmol/L   CO2 25 22 - 32 mmol/L   Glucose, Bld 96 70 - 99 mg/dL   BUN 13 6 - 20 mg/dL   Creatinine, Ser 0.64 0.44 - 1.00 mg/dL   Calcium 9.0 8.9 - 10.3 mg/dL   GFR, Estimated >60 >60 mL/min   Anion gap 7 5 -  15    No results found.  Assessment/Plan: 71Y G2P0020 female with AUB failed medical management consented for hysteroscopy, D&C, and novasure endometrial ablation   Patient has been consented in the office and again in the preoperative holding unit for the above listed procedure. She is aware of the risks of surgery including but not limited to bleeding, infection, damage to surrounding organs, uterine perforation, and inherit risks of anesthesia. She is aware of risks of persistent bleeding after ablation. She has verbalized good understanding of risks, questions answered to her satisfaction, and consents signed.    -NPO -No antibiotics indicated -SCD VTE ppx, hx of OCP related PE planning for Lovenox ppx to begin POD1 x10d per PCP recommendations -UPT NEG -Routine preop/postop care -Anticipate DC home today. DC instructions reviewed and plan for postop follow up in the office in 2 weeks -POST OP pain medications previously sent and patient has at home   [place] 08/18/2022, 9:20 AM

## 2022-08-18 NOTE — Anesthesia Postprocedure Evaluation (Signed)
Anesthesia Post Note  Patient: Medical illustrator  Procedure(s) Performed: DILATATION & CURETTAGE/HYSTEROSCOPY WITH NOVASURE ABLATION (Uterus)     Patient location during evaluation: PACU Anesthesia Type: General Level of consciousness: awake and alert Pain management: pain level controlled Vital Signs Assessment: post-procedure vital signs reviewed and stable Respiratory status: spontaneous breathing, nonlabored ventilation, respiratory function stable and patient connected to nasal cannula oxygen Cardiovascular status: blood pressure returned to baseline and stable Postop Assessment: no apparent nausea or vomiting Anesthetic complications: no  No notable events documented.  Last Vitals:  Vitals:   08/18/22 1107 08/18/22 1115  BP:    Pulse:    Resp:    Temp:  36.5 C  SpO2: 100%     Last Pain:  Vitals:   08/18/22 1115  TempSrc:   PainSc: 5                  Barnet Glasgow

## 2022-08-18 NOTE — Op Note (Signed)
Keary Bushway 02-17-1976 [id number]   Operative Note  PROCEDURE: diagnostic hysteroscopy, dilation and curettage, and endometrial ablation  PRE-OPERATIVE DIAGNOSIS: AUB  POST-OPERATIVE DIAGNOSIS: AUB  SURGEON: Dr. Langley Gauss, DO  ASSISTANT: N/A  FINDINGS: atrophic vaginal epithelium, normal appearing ectocervix without lesions, stenotic cervical os, uterine cavity with bilateral tubal ostia visualized, normal cavity without polyps or fibroids  SPECIMENS: endometrial curettings  EBL: minimal  FLUIDS: Deficit 809 mL  COMPLICATIONS: None  PROCEDURE IN DETAIL:   After the patient was appropriately consented in the holding area, she was taken to the operating room where general anesthesia was administered without complications. The patient was placed in the dorsal lithotomy position. The patient was prepped and draped in the usual sterile fashion. The bladder was trained with a catheter. An appropriate time out was performed that verified the correct patient, procedure, and surgical team.   A sterile speculum was inserted into the vagina and the cervix was visualized. A single tooth tenaculum was used to grasp the anterior lip of the cervix. A paracervical block was obtained using 1% plain lidocaine injecting a total of 10 cc divided between the 4 and 8 o'clock positions. The cervix was noted to be stenotic and an os finder used to assist in dilation. The cervix was sequentially dilated up to a size 19 french. The hysteroscope was introduced through the cervix and advanced to the uterine fundus. The uterine cavity distension was obtained without difficulty and the findings were noted as described above.The hysteroscope was removed. A gentle sharp curetting was performed and specimens collected on a Telfa. All specimens were sent for final pathology. The Novasure endometrial ablation device was opened onto the sterile field and the uterine cavity length and cervical length measurements  were input into the tower. The Novasure was gently advanced to the fundus and the fan was deployed. The uterine cavity width was noted. The cavity seal assessment was performed and passed. The ablation was initiated with a total of 1 minute 16 seconds run time. The Novasure apparatus was then removed. The tenaculum was removed and excellent hemostasis was appreciated. Speculum was removed. Patient tolerated the procedure well and was taken to the recovery room in stable condition. All instrument and lap counts were correct.  Alphonsine Minium A Shomari Matusik 08/18/22 11:51 AM

## 2022-08-18 NOTE — Discharge Instructions (Addendum)
  Post Anesthesia Home Care Instructions  Activity: Get plenty of rest for the remainder of the day. A responsible individual must stay with you for 24 hours following the procedure.  For the next 24 hours, DO NOT: -Drive a car -Paediatric nurse -Drink alcoholic beverages -Take any medication unless instructed by your physician -Make any legal decisions or sign important papers.  Meals: Start with liquid foods such as gelatin or soup. Progress to regular foods as tolerated. Avoid greasy, spicy, heavy foods. If nausea and/or vomiting occur, drink only clear liquids until the nausea and/or vomiting subsides. Call your physician if vomiting continues.  Special Instructions/Symptoms: Your throat may feel dry or sore from the anesthesia or the breathing tube placed in your throat during surgery. If this causes discomfort, gargle with warm salt water. The discomfort should disappear within 24 hours.    No Ibuprofen until 4:30 p.m.

## 2022-08-18 NOTE — Transfer of Care (Signed)
Immediate Anesthesia Transfer of Care Note  Patient: Deanna Herman  Procedure(s) Performed: DILATATION & CURETTAGE/HYSTEROSCOPY WITH NOVASURE ABLATION (Uterus)  Patient Location: PACU  Anesthesia Type:General  Level of Consciousness: awake, alert , oriented, and patient cooperative  Airway & Oxygen Therapy: Patient Spontanous Breathing  Post-op Assessment: Report given to RN and Post -op Vital signs reviewed and stable  Post vital signs: Reviewed and stable  Last Vitals:  Vitals Value Taken Time  BP    Temp    Pulse 59 08/18/22 1042  Resp 12 08/18/22 1042  SpO2 99 % 08/18/22 1042  Vitals shown include unvalidated device data.  Last Pain:  Vitals:   08/18/22 0804  TempSrc: Oral  PainSc: 3       Patients Stated Pain Goal: 6 (82/70/78 6754)  Complications: No notable events documented.

## 2022-08-18 NOTE — Anesthesia Procedure Notes (Signed)
Procedure Name: LMA Insertion Date/Time: 08/18/2022 9:58 AM  Performed by: Lollie Sails, CRNAPre-anesthesia Checklist: Patient identified, Emergency Drugs available, Suction available, Patient being monitored and Timeout performed Patient Re-evaluated:Patient Re-evaluated prior to induction Oxygen Delivery Method: Circle system utilized Preoxygenation: Pre-oxygenation with 100% oxygen Induction Type: IV induction Ventilation: Mask ventilation without difficulty LMA: LMA inserted LMA Size: 4.0 Number of attempts: 1 Placement Confirmation: positive ETCO2 and breath sounds checked- equal and bilateral Tube secured with: Tape Dental Injury: Teeth and Oropharynx as per pre-operative assessment

## 2022-08-21 ENCOUNTER — Encounter (HOSPITAL_BASED_OUTPATIENT_CLINIC_OR_DEPARTMENT_OTHER): Payer: Self-pay | Admitting: Obstetrics and Gynecology

## 2022-08-21 LAB — SURGICAL PATHOLOGY

## 2022-12-27 DIAGNOSIS — D2371 Other benign neoplasm of skin of right lower limb, including hip: Secondary | ICD-10-CM | POA: Diagnosis not present

## 2022-12-27 DIAGNOSIS — L814 Other melanin hyperpigmentation: Secondary | ICD-10-CM | POA: Diagnosis not present

## 2022-12-27 DIAGNOSIS — L821 Other seborrheic keratosis: Secondary | ICD-10-CM | POA: Diagnosis not present

## 2022-12-27 DIAGNOSIS — Z872 Personal history of diseases of the skin and subcutaneous tissue: Secondary | ICD-10-CM | POA: Diagnosis not present

## 2023-01-03 DIAGNOSIS — M79604 Pain in right leg: Secondary | ICD-10-CM | POA: Diagnosis not present

## 2023-01-04 DIAGNOSIS — M79604 Pain in right leg: Secondary | ICD-10-CM | POA: Diagnosis not present

## 2023-02-22 DIAGNOSIS — L709 Acne, unspecified: Secondary | ICD-10-CM | POA: Diagnosis not present

## 2023-02-22 DIAGNOSIS — B009 Herpesviral infection, unspecified: Secondary | ICD-10-CM | POA: Diagnosis not present

## 2023-02-22 DIAGNOSIS — Z0000 Encounter for general adult medical examination without abnormal findings: Secondary | ICD-10-CM | POA: Diagnosis not present

## 2023-03-13 DIAGNOSIS — Z1212 Encounter for screening for malignant neoplasm of rectum: Secondary | ICD-10-CM | POA: Diagnosis not present

## 2023-03-13 DIAGNOSIS — Z1211 Encounter for screening for malignant neoplasm of colon: Secondary | ICD-10-CM | POA: Diagnosis not present

## 2023-08-10 DIAGNOSIS — Z1331 Encounter for screening for depression: Secondary | ICD-10-CM | POA: Diagnosis not present

## 2023-08-10 DIAGNOSIS — Z01419 Encounter for gynecological examination (general) (routine) without abnormal findings: Secondary | ICD-10-CM | POA: Diagnosis not present

## 2023-08-10 DIAGNOSIS — Z1231 Encounter for screening mammogram for malignant neoplasm of breast: Secondary | ICD-10-CM | POA: Diagnosis not present

## 2023-08-15 ENCOUNTER — Other Ambulatory Visit: Payer: Self-pay

## 2023-08-15 ENCOUNTER — Emergency Department (HOSPITAL_COMMUNITY): Payer: BC Managed Care – PPO

## 2023-08-15 ENCOUNTER — Encounter (HOSPITAL_COMMUNITY): Payer: Self-pay | Admitting: Emergency Medicine

## 2023-08-15 ENCOUNTER — Emergency Department (HOSPITAL_COMMUNITY)
Admission: EM | Admit: 2023-08-15 | Discharge: 2023-08-15 | Disposition: A | Discharge status: Home / Self Care | Payer: BC Managed Care – PPO

## 2023-08-15 ENCOUNTER — Ambulatory Visit (HOSPITAL_COMMUNITY)
Admission: EM | Admit: 2023-08-15 | Discharge: 2023-08-15 | Disposition: A | Discharge status: Home / Self Care | Payer: BC Managed Care – PPO | Source: Home / Self Care

## 2023-08-15 DIAGNOSIS — Z7982 Long term (current) use of aspirin: Secondary | ICD-10-CM | POA: Insufficient documentation

## 2023-08-15 DIAGNOSIS — N132 Hydronephrosis with renal and ureteral calculous obstruction: Secondary | ICD-10-CM | POA: Diagnosis not present

## 2023-08-15 DIAGNOSIS — R110 Nausea: Secondary | ICD-10-CM | POA: Insufficient documentation

## 2023-08-15 DIAGNOSIS — M545 Low back pain, unspecified: Secondary | ICD-10-CM

## 2023-08-15 DIAGNOSIS — R350 Frequency of micturition: Secondary | ICD-10-CM | POA: Insufficient documentation

## 2023-08-15 DIAGNOSIS — N201 Calculus of ureter: Secondary | ICD-10-CM | POA: Diagnosis not present

## 2023-08-15 DIAGNOSIS — Z91040 Latex allergy status: Secondary | ICD-10-CM | POA: Diagnosis not present

## 2023-08-15 DIAGNOSIS — R109 Unspecified abdominal pain: Secondary | ICD-10-CM | POA: Diagnosis not present

## 2023-08-15 DIAGNOSIS — K7689 Other specified diseases of liver: Secondary | ICD-10-CM | POA: Diagnosis not present

## 2023-08-15 LAB — CBC
HCT: 41 % (ref 36.0–46.0)
Hemoglobin: 13.6 g/dL (ref 12.0–15.0)
MCH: 30 pg (ref 26.0–34.0)
MCHC: 33.2 g/dL (ref 30.0–36.0)
MCV: 90.5 fL (ref 80.0–100.0)
Platelets: 302 10*3/uL (ref 150–400)
RBC: 4.53 MIL/uL (ref 3.87–5.11)
RDW: 12 % (ref 11.5–15.5)
WBC: 11.7 10*3/uL — ABNORMAL HIGH (ref 4.0–10.5)
nRBC: 0 % (ref 0.0–0.2)

## 2023-08-15 LAB — HCG, QUANTITATIVE, PREGNANCY: hCG, Beta Chain, Quant, S: 1 m[IU]/mL (ref <5)

## 2023-08-15 LAB — COMPREHENSIVE METABOLIC PANEL
ALT: 19 U/L (ref 0–44)
AST: 20 U/L (ref 15–41)
Albumin: 4.1 g/dL (ref 3.5–5.0)
Alkaline Phosphatase: 58 U/L (ref 38–126)
Anion gap: 12 (ref 5–15)
BUN: 11 mg/dL (ref 6–20)
CO2: 20 mmol/L — ABNORMAL LOW (ref 22–32)
Calcium: 9.4 mg/dL (ref 8.9–10.3)
Chloride: 106 mmol/L (ref 98–111)
Creatinine, Ser: 0.89 mg/dL (ref 0.44–1.00)
GFR, Estimated: >60 mL/min (ref >60)
Glucose, Bld: 125 mg/dL — ABNORMAL HIGH (ref 70–99)
Potassium: 4.2 mmol/L (ref 3.5–5.1)
Sodium: 138 mmol/L (ref 135–145)
Total Bilirubin: 0.6 mg/dL (ref 0.3–1.2)
Total Protein: 6.9 g/dL (ref 6.5–8.1)

## 2023-08-15 LAB — URINALYSIS, ROUTINE W REFLEX MICROSCOPIC
Bacteria, UA: NONE SEEN
Bilirubin Urine: NEGATIVE
Glucose, UA: NEGATIVE mg/dL
Hgb urine dipstick: NEGATIVE
Ketones, ur: 20 mg/dL — AB
Leukocytes,Ua: NEGATIVE
Nitrite: NEGATIVE
Protein, ur: 30 mg/dL — AB
Specific Gravity, Urine: 1.026 (ref 1.005–1.030)
pH: 5 (ref 5.0–8.0)

## 2023-08-15 LAB — POCT URINALYSIS DIP (MANUAL ENTRY)
Glucose, UA: NEGATIVE mg/dL
Leukocytes, UA: NEGATIVE
Nitrite, UA: NEGATIVE
Protein Ur, POC: NEGATIVE mg/dL
Spec Grav, UA: >=1.03 — AB (ref 1.010–1.025)
Urobilinogen, UA: 0.2 U/dL
pH, UA: 5.5 (ref 5.0–8.0)

## 2023-08-15 LAB — POCT URINE PREGNANCY: Preg Test, Ur: NEGATIVE

## 2023-08-15 MED ORDER — ONDANSETRON HCL 4 MG PO TABS: 4.0000 mg | ORAL_TABLET | Freq: Four times a day (QID) | ORAL | 0 refills | Status: DC

## 2023-08-15 MED ORDER — ONDANSETRON 4 MG PO TBDP
4.0000 mg | ORAL_TABLET | Freq: Once | ORAL | Status: AC
  Administered 2023-08-15: 4 mg via ORAL

## 2023-08-15 MED ORDER — ONDANSETRON 4 MG PO TBDP
ORAL_TABLET | ORAL | Status: AC
  Filled 2023-08-15: qty 1

## 2023-08-15 MED ORDER — KETOROLAC TROMETHAMINE 60 MG/2ML IM SOLN
INTRAMUSCULAR | Status: AC
Start: 2023-08-15 — End: ?
  Filled 2023-08-15: qty 2

## 2023-08-15 MED ORDER — OXYCODONE-ACETAMINOPHEN 5-325 MG PO TABS: 1.0000 | ORAL_TABLET | Freq: Four times a day (QID) | ORAL | 0 refills | Status: AC | PRN

## 2023-08-15 MED ORDER — KETOROLAC TROMETHAMINE 10 MG PO TABS: 10.0000 mg | ORAL_TABLET | Freq: Four times a day (QID) | ORAL | 0 refills | Status: AC | PRN

## 2023-08-15 MED ORDER — KETOROLAC TROMETHAMINE 60 MG/2ML IM SOLN
60.0000 mg | Freq: Once | INTRAMUSCULAR | Status: AC
  Administered 2023-08-15: 60 mg via INTRAMUSCULAR

## 2023-08-15 MED ORDER — OXYCODONE-ACETAMINOPHEN 5-325 MG PO TABS: 1.0000 | ORAL_TABLET | Freq: Four times a day (QID) | ORAL | 0 refills | Status: DC | PRN

## 2023-08-15 MED ORDER — KETOROLAC TROMETHAMINE 10 MG PO TABS: 10.0000 mg | ORAL_TABLET | Freq: Four times a day (QID) | ORAL | 0 refills | Status: DC | PRN

## 2023-08-15 MED ORDER — ONDANSETRON HCL 4 MG PO TABS: 4.0000 mg | ORAL_TABLET | Freq: Four times a day (QID) | ORAL | 0 refills | Status: AC

## 2023-08-15 MED ORDER — MORPHINE SULFATE (PF) 4 MG/ML IV SOLN
4.0000 mg | Freq: Once | INTRAVENOUS | Status: AC
  Administered 2023-08-15: 4 mg via INTRAVENOUS
  Filled 2023-08-15: qty 1

## 2023-08-15 MED ORDER — ONDANSETRON HCL 4 MG/2ML IJ SOLN
4.0000 mg | Freq: Once | INTRAMUSCULAR | Status: AC
  Administered 2023-08-15: 4 mg via INTRAVENOUS
  Filled 2023-08-15: qty 2

## 2023-08-15 NOTE — ED Triage Notes (Addendum)
Pt reports stabbing right flank pain around 9 this morning. Pt also reports n/v and urinary urgency.

## 2023-08-15 NOTE — ED Notes (Signed)
Discharge instructions provided by edp were discussed with pt. Pt verbalized understanding with no additional questions at this time. Pt to go home with s/o at bedside  

## 2023-08-15 NOTE — ED Provider Notes (Signed)
[facility]    CSN: [id number] Arrival date & time: 08/15/23  9562      History   Chief Complaint No chief complaint on file.   HPI Deanna Herman is a 47 y.o. female.   Patient presents to clinic complaining of right flank pain, nausea, feeling dizzy and abdominal cramping that started this morning.  Pain is constant but has times of waxing and waning.   She has been urinating frequently, small amounts. No dysuria.   She has had some vaginal spotting, she does have a clotting disorder and had an ablation last year.  She is sexually active, her partner had a vasectomy.  No hx of kidney stones. No fevers. No hematuria.   The history is provided by the patient and medical records.    Past Medical History:  Diagnosis Date   Abnormal uterine bleeding (AUB)    Arthritis    Cervical stenosis (uterine cervix)    Factor VIII deficiency (HCC)    per pt after PE 01/ 2008 work-up done and found to have factor VIII,  takes ASA 81mg  daily   Frequency of urination    History of anemia    History of cervical dysplasia 04/2007   s/p  cervical cone bx/ LEEP for  CIN I  (done in gyn office)   History of COVID-19 10/2021   per pt mild symptoms that resolved   History of herpes genitalis    History of nonmelanoma skin cancer    History of pulmonary embolism 10/20/2006   admission in epic pulm artery branchs to [facility],  completed coumadin,  (12-26-2021 pt stated provoked by taking birth control pill, smoker and recently travelled;  stated no clots prior and none since)   PONV (postoperative nausea and vomiting)    Wears contact lenses     There are no problems to display for this patient.   Past Surgical History:  Procedure Laterality Date   DILITATION & CURRETTAGE/HYSTROSCOPY WITH NOVASURE ABLATION N/A 08/18/2022   Procedure: DILATATION & CURETTAGE/HYSTEROSCOPY WITH NOVASURE ABLATION;  Surgeon: Toy Baker, DO;  Location: [facility];  Service:  Gynecology;  Laterality: N/A;   HYSTEROSCOPY WITH D & C N/A 12/29/2021   Procedure: DILATATION AND CURETTAGE /HYSTEROSCOPY;  Surgeon: Toy Baker, DO;  Location: [facility];  Service: Gynecology;  Laterality: N/A;   INGUINAL HERNIA REPAIR Bilateral    child   THYROGLOSSAL DUCT CYST     per pt x5  as child last two 1979 and 1987   TONSILLECTOMY     age 56    OB History   No obstetric history on file.      Home Medications    Prior to Admission medications   Medication Sig Start Date End Date Taking? Authorizing Provider  aspirin EC 81 MG tablet Take 81 mg by mouth daily. Swallow whole.    [provider]  Calcium Citrate-Vitamin D (CALCIUM + D PO) Take by mouth daily.    [provider]  Cholecalciferol (VITAMIN D-3 PO) Take by mouth daily.    [provider]  clindamycin-benzoyl peroxide (BENZACLIN) gel Apply topically daily.    [provider]  Multiple Vitamins-Minerals (MULTIVITAMIN WOMEN PO) Take by mouth daily at 12 noon.    [provider]  tazarotene (TAZORAC) 0.05 % cream Apply topically at bedtime.    [provider]    Family History No family history on file.  Social History Social History   Tobacco  Use   Smoking status: Former    Current packs/day: 0.00    Types: Cigarettes    Start date: 38    Quit date: 2019    Years since quitting: 5.8   Smokeless tobacco: Never  Vaping Use   Vaping status: Every Day   Substances: Nicotine   Devices: juul  Substance Use Topics   Alcohol use: Yes    Comment: occasional   Drug use: Never     Allergies   Latex and Tamiflu [oseltamivir phosphate]   Review of Systems Review of Systems  Per HPI   Physical Exam Triage Vital Signs ED Triage Vitals  Encounter Vitals Group     BP 08/15/23 1002 112/66     Systolic BP Percentile --      Diastolic BP Percentile --      Pulse Rate 08/15/23 1002 (!) 49     Resp 08/15/23 1002 18     Temp  08/15/23 1002 97.6 F (36.4 C)     Temp Source 08/15/23 1002 Oral     SpO2 08/15/23 1002 99 %     Weight --      Height --      Head Circumference --      Peak Flow --      Pain Score 08/15/23 0959 8     Pain Loc --      Pain Education --      Exclude from Growth Chart --    No data found.  Updated Vital Signs BP 112/66 (BP Location: Right Arm)   Pulse (!) 49   Temp 97.6 F (36.4 C) (Oral)   Resp 18   SpO2 99%   Visual Acuity Right Eye Distance:   Left Eye Distance:   Bilateral Distance:    Right Eye Near:   Left Eye Near:    Bilateral Near:     Physical Exam Vitals and nursing note reviewed.  Constitutional:      General: She is in acute distress.  HENT:     Head: Normocephalic and atraumatic.     Right Ear: External ear normal.     Left Ear: External ear normal.     Nose: Nose normal.     Mouth/Throat:     Mouth: Mucous membranes are moist.  Eyes:     Conjunctiva/sclera: Conjunctivae normal.  Cardiovascular:     Rate and Rhythm: Normal rate and regular rhythm.     Heart sounds: Normal heart sounds. No murmur heard. Pulmonary:     Effort: Pulmonary effort is normal. No respiratory distress.     Breath sounds: Normal breath sounds.  Abdominal:     Tenderness: There is no right CVA tenderness or left CVA tenderness.  Musculoskeletal:        General: Normal range of motion.  Skin:    General: Skin is warm and dry.  Neurological:     General: No focal deficit present.  Psychiatric:        Mood and Affect: Mood normal.      UC Treatments / Results  Labs (all labs ordered are listed, but only abnormal results are displayed) Labs Reviewed  POCT URINALYSIS DIP (MANUAL ENTRY) - Abnormal; Notable for the following components:      Result Value   Bilirubin, UA small (*)    Ketones, POC UA trace (5) (*)    Spec Grav, UA >=1.030 (*)    Blood, UA trace-lysed (*)    All other components within normal  limits  HCG, QUANTITATIVE, PREGNANCY  POCT URINE  PREGNANCY    EKG   Radiology No results found.  Procedures Procedures (including critical care time)  Medications Ordered in UC Medications  ketorolac (TORADOL) injection 60 mg (60 mg Intramuscular Given 08/15/23 1103)  ondansetron (ZOFRAN-ODT) disintegrating tablet 4 mg (4 mg Oral Given 08/15/23 1106)    Initial Impression / Assessment and Plan / UC Course  I have reviewed the triage vital signs and the nursing notes.  Pertinent labs & imaging results that were available during my care of the patient were reviewed by me and considered in my medical decision making (see chart for details).  Vitals and triage reviewed, patient is hemodynamically stable.  Urinary frequency and nausea.  Flank pain.  Suspect kidney stone, IM Toradol and ODT Zofran given in clinic.  Patient has been nonstop back and forth to the bathroom urinating only small amounts at a time, concern for kidney stone with potential obstruction versus other acute etiologies.  Initial urine pregnancy showed a potential faint positive line, other 2 were negative.  Suggest hCG to be done in the emergency department.  Advised further evaluation at the nearest emergency department.  Patient will be transported by significant other via POV.     Final Clinical Impressions(s) / UC Diagnoses   Final diagnoses:  Nausea without vomiting  Acute right-sided low back pain, unspecified whether sciatica present  Urinary frequency   Discharge Instructions   None    ED Prescriptions   None    PDMP not reviewed this encounter.   Paislee Szatkowski, [place] N, [place] 08/15/23 1316

## 2023-08-15 NOTE — Discharge Instructions (Signed)
Your CT scan does show a kidney stone.  This looks like it should pass into the bladder momentarily.  Please take the Toradol for pain.  If you are still having pain is educated take the Percocet.  Do not drive or drink alcohol while taking the Percocet as may make you drowsy.  Take the Zofran as needed for nausea.  Please call and schedule follow-up appointment with the urologist at the number provided.  Return to the emergency department for worsening symptoms such as fevers or worsening pain.

## 2023-08-15 NOTE — ED Provider Notes (Signed)
[place] EMERGENCY DEPARTMENT AT [facility] Provider Note   CSN: [id number] Arrival date & time: 08/15/23  1127     History  Chief Complaint  Patient presents with   Flank Pain    Deanna Herman is a 47 y.o. female.  47 year old female with past medical history of factor VIII Leiden deficiency and history of provoked pulmonary embolism in the past presenting to the emergency department today with right-sided flank pain.  The patient states this began at 9 AM this morning.  She reports that it was in her right flank area.  She has had some increased urinary frequency and urgency throughout the day today.  Denies any vaginal bleeding or discharge.  She did have some nausea and vomiting.  She reports normal bowel movements with this.  Denies any anterior abdominal pain.  She denies any chest pain or shortness of breath.  She went to urgent care initially was given Toradol and Zofran with some improvement.  She reports the pain has been waxing and waning in intensity.  She came to the ER for further evaluation.  She denies any history of kidney stones.   Flank Pain       Home Medications Prior to Admission medications   Medication Sig Start Date End Date Taking? Authorizing Provider  ketorolac (TORADOL) 10 MG tablet Take 1 tablet (10 mg total) by mouth every 6 (six) hours as needed. 08/15/23  Yes Durwin Glaze, MD  ondansetron (ZOFRAN) 4 MG tablet Take 1 tablet (4 mg total) by mouth every 6 (six) hours. 08/15/23  Yes Durwin Glaze, MD  oxyCODONE-acetaminophen (PERCOCET/ROXICET) 5-325 MG tablet Take 1 tablet by mouth every 6 (six) hours as needed for severe pain (pain score 7-10). 08/15/23  Yes Durwin Glaze, MD  aspirin EC 81 MG tablet Take 81 mg by mouth daily. Swallow whole.    [provider]  Calcium Citrate-Vitamin D (CALCIUM + D PO) Take by mouth daily.    [provider]  Cholecalciferol (VITAMIN D-3 PO) Take by mouth daily.    [provider]  clindamycin-benzoyl peroxide (BENZACLIN) gel Apply topically daily.    [provider]  Multiple Vitamins-Minerals (MULTIVITAMIN WOMEN PO) Take by mouth daily at 12 noon.    [provider]  tazarotene (TAZORAC) 0.05 % cream Apply topically at bedtime.    [provider]      Allergies    Latex and Tamiflu [oseltamivir phosphate]    Review of Systems   Review of Systems  Gastrointestinal:  Positive for nausea and vomiting.  Genitourinary:  Positive for flank pain.    Physical Exam Updated Vital Signs BP 117/67   Pulse 82   Temp 99.5 F (37.5 C) (Oral)   Resp 18   SpO2 98%  Physical Exam Vitals and nursing note reviewed.   Gen: NAD Eyes: PERRL, EOMI HEENT: no oropharyngeal swelling Neck: trachea midline Resp: clear to auscultation bilaterally Card: RRR, no murmurs, rubs, or gallops Abd: nontender, nondistended anteriorly, there is right-sided CVA tenderness that is mild in character Extremities: no calf tenderness, no edema Vascular: 2+ radial pulses bilaterally, 2+ DP pulses bilaterally Skin: no rashes Psyc: acting appropriately   ED Results / Procedures / Treatments   Labs (all labs ordered are listed, but only abnormal results are displayed) Labs Reviewed  COMPREHENSIVE METABOLIC PANEL - Abnormal; Notable for the following components:      Result Value   CO2 20 (*)    Glucose, Bld  125 (*)    All other components within normal limits  CBC - Abnormal; Notable for the following components:   WBC 11.7 (*)    All other components within normal limits  URINALYSIS, ROUTINE W REFLEX MICROSCOPIC - Abnormal; Notable for the following components:   Color, Urine AMBER (*)    APPearance TURBID (*)    Ketones, ur 20 (*)    Protein, ur 30 (*)    All other components within normal limits    EKG None  Radiology CT ABDOMEN PELVIS WO CONTRAST  Result Date: 08/15/2023 CLINICAL DATA:  Right flank pain EXAM: CT ABDOMEN AND  PELVIS WITHOUT CONTRAST TECHNIQUE: Multidetector CT imaging of the abdomen and pelvis was performed following the standard protocol without IV contrast. RADIATION DOSE REDUCTION: This exam was performed according to the departmental dose-optimization program which includes automated exposure control, adjustment of the mA and/or kV according to patient size and/or use of iterative reconstruction technique. COMPARISON:  None Available. FINDINGS: Lower chest: No acute abnormality. Hepatobiliary: No suspicious focal hepatic abnormality. Small cyst in the anterior left hepatic lobe. Gallbladder unremarkable. Pancreas: No focal abnormality or ductal dilatation. Spleen: No focal abnormality.  Normal size. Adrenals/Urinary Tract: Adrenal glands normal. Mild right hydronephrosis and perinephric stranding due to 2 mm right UVJ stone. No stones or hydronephrosis on the left. Urinary bladder decompressed, grossly unremarkable. Stomach/Bowel: Normal appendix. Stomach, large and small bowel grossly unremarkable. Vascular/Lymphatic: No evidence of aneurysm or adenopathy. Reproductive: Uterus and adnexa unremarkable.  No mass. Other: No free fluid or free air. Musculoskeletal: No acute bony abnormality. IMPRESSION: 2 mm right UVJ stone with mild right hydronephrosis and perinephric stranding. Electronically Signed   By: Charlett Nose M.D.   On: 08/15/2023 20:30    Procedures Procedures    Medications Ordered in ED Medications  morphine (PF) 4 MG/ML injection 4 mg (4 mg Intravenous Given 08/15/23 1814)  ondansetron (ZOFRAN) injection 4 mg (4 mg Intravenous Given 08/15/23 1813)    ED Course/ Medical Decision Making/ A&P                                 Medical Decision Making 47 year old female with past medical history of factor VIII Leiden deficiency and provoked DVT in the past presents to the emergency department today with right-sided flank pain with some increased urinary frequency and urgency.  I will further  evaluate patient here with basic labs Wels urinalysis about for pyelonephritis.  Will obtain a pregnancy test to evaluate for intrauterine versus ectopic pregnancy.  I will give the patient morphine and Zofran for symptoms.  Will obtain a noncontrast CT scan to evaluate for ureterolithiasis, colitis, diverticulitis, or other intra-abdominal pathology.  Based on the location of her pain with this being more flank pain suspicion for ovarian pathology is low at this time.  I will reevaluate for ultimate disposition.  The patient was found to have a 2 mm stone at the UVJ.  Her symptoms have improved now.  There does not appear to be infection in her urine.  I think that she is stable for discharge.  Amount and/or Complexity of Data Reviewed Labs: ordered. Radiology: ordered.  Risk Prescription drug management.           Final Clinical Impression(s) / ED Diagnoses Final diagnoses:  Ureterolithiasis    Rx / DC Orders ED Discharge Orders          Ordered  ketorolac (TORADOL) 10 MG tablet  Every 6 hours PRN        08/15/23 2207    oxyCODONE-acetaminophen (PERCOCET/ROXICET) 5-325 MG tablet  Every 6 hours PRN        08/15/23 2207    ondansetron (ZOFRAN) 4 MG tablet  Every 6 hours        08/15/23 2207              Durwin Glaze, MD 08/15/23 2209

## 2023-08-15 NOTE — ED Notes (Signed)
Patient is being discharged from the Urgent Care and sent to the Emergency Department via POV . Per [place], NP, patient is in need of higher level of care due to limited resources, back pain and vomiting. Patient is aware and verbalizes understanding of plan of care.  Vitals:   08/15/23 1002  BP: 112/66  Pulse: (!) 49  Resp: 18  Temp: 97.6 F (36.4 C)  SpO2: 99%

## 2023-08-15 NOTE — ED Notes (Signed)
Provided cool compress and ice pack

## 2023-08-15 NOTE — ED Triage Notes (Signed)
Pt present with shooting back pain on right side, nausea, feeling faint, and abdominal cramps that started this am. While in waiting room she became very faint.   Pt states she has clotting disorder but does not think this is related.

## 2023-08-21 DIAGNOSIS — Z87442 Personal history of urinary calculi: Secondary | ICD-10-CM | POA: Diagnosis not present

## 2023-09-26 DIAGNOSIS — J0100 Acute maxillary sinusitis, unspecified: Secondary | ICD-10-CM | POA: Diagnosis not present

## 2024-01-04 DIAGNOSIS — D225 Melanocytic nevi of trunk: Secondary | ICD-10-CM | POA: Diagnosis not present

## 2024-01-04 DIAGNOSIS — D485 Neoplasm of uncertain behavior of skin: Secondary | ICD-10-CM | POA: Diagnosis not present

## 2024-01-04 DIAGNOSIS — L811 Chloasma: Secondary | ICD-10-CM | POA: Diagnosis not present

## 2024-01-04 DIAGNOSIS — Z808 Family history of malignant neoplasm of other organs or systems: Secondary | ICD-10-CM | POA: Diagnosis not present

## 2024-01-04 DIAGNOSIS — L578 Other skin changes due to chronic exposure to nonionizing radiation: Secondary | ICD-10-CM | POA: Diagnosis not present

## 2024-01-04 DIAGNOSIS — L700 Acne vulgaris: Secondary | ICD-10-CM | POA: Diagnosis not present

## 2024-01-21 DIAGNOSIS — J00 Acute nasopharyngitis [common cold]: Secondary | ICD-10-CM | POA: Diagnosis not present

## 2024-01-21 DIAGNOSIS — Z6832 Body mass index (BMI) 32.0-32.9, adult: Secondary | ICD-10-CM | POA: Diagnosis not present

## 2024-02-05 DIAGNOSIS — D485 Neoplasm of uncertain behavior of skin: Secondary | ICD-10-CM | POA: Diagnosis not present

## 2024-02-05 DIAGNOSIS — L988 Other specified disorders of the skin and subcutaneous tissue: Secondary | ICD-10-CM | POA: Diagnosis not present

## 2024-02-16 DIAGNOSIS — H00022 Hordeolum internum right lower eyelid: Secondary | ICD-10-CM | POA: Diagnosis not present

## 2024-03-13 DIAGNOSIS — Z6831 Body mass index (BMI) 31.0-31.9, adult: Secondary | ICD-10-CM | POA: Diagnosis not present

## 2024-03-13 DIAGNOSIS — L709 Acne, unspecified: Secondary | ICD-10-CM | POA: Diagnosis not present

## 2024-03-13 DIAGNOSIS — Z0000 Encounter for general adult medical examination without abnormal findings: Secondary | ICD-10-CM | POA: Diagnosis not present

## 2024-03-13 DIAGNOSIS — Z720 Tobacco use: Secondary | ICD-10-CM | POA: Diagnosis not present

## 2024-05-24 DIAGNOSIS — R21 Rash and other nonspecific skin eruption: Secondary | ICD-10-CM | POA: Diagnosis not present

## 2024-06-04 DIAGNOSIS — Z6832 Body mass index (BMI) 32.0-32.9, adult: Secondary | ICD-10-CM | POA: Diagnosis not present

## 2024-06-04 DIAGNOSIS — R21 Rash and other nonspecific skin eruption: Secondary | ICD-10-CM | POA: Diagnosis not present

## 2024-06-12 DIAGNOSIS — B351 Tinea unguium: Secondary | ICD-10-CM | POA: Diagnosis not present

## 2024-06-12 DIAGNOSIS — L218 Other seborrheic dermatitis: Secondary | ICD-10-CM | POA: Diagnosis not present

## 2024-07-04 DIAGNOSIS — Z86018 Personal history of other benign neoplasm: Secondary | ICD-10-CM | POA: Diagnosis not present

## 2024-07-04 DIAGNOSIS — D225 Melanocytic nevi of trunk: Secondary | ICD-10-CM | POA: Diagnosis not present

## 2024-07-04 DIAGNOSIS — L814 Other melanin hyperpigmentation: Secondary | ICD-10-CM | POA: Diagnosis not present

## 2024-07-04 DIAGNOSIS — L578 Other skin changes due to chronic exposure to nonionizing radiation: Secondary | ICD-10-CM | POA: Diagnosis not present

## 2024-07-11 DIAGNOSIS — Z23 Encounter for immunization: Secondary | ICD-10-CM | POA: Diagnosis not present

## 2024-08-15 DIAGNOSIS — Z01419 Encounter for gynecological examination (general) (routine) without abnormal findings: Secondary | ICD-10-CM | POA: Diagnosis not present

## 2024-08-15 DIAGNOSIS — Z124 Encounter for screening for malignant neoplasm of cervix: Secondary | ICD-10-CM | POA: Diagnosis not present

## 2024-08-15 DIAGNOSIS — Z1331 Encounter for screening for depression: Secondary | ICD-10-CM | POA: Diagnosis not present

## 2024-08-15 DIAGNOSIS — Z1231 Encounter for screening mammogram for malignant neoplasm of breast: Secondary | ICD-10-CM | POA: Diagnosis not present

## 2024-08-15 DIAGNOSIS — Z01411 Encounter for gynecological examination (general) (routine) with abnormal findings: Secondary | ICD-10-CM | POA: Diagnosis not present
